# Patient Record
Sex: Male | Born: 1955 | Race: White | Hispanic: No | Marital: Married | Smoking: Never smoker
Health system: Southern US, Community
[De-identification: ages and names within clinical notes are randomized; demographics above are authoritative.]

## PROBLEM LIST (undated history)

## (undated) DIAGNOSIS — J45909 Unspecified asthma, uncomplicated: Secondary | ICD-10-CM

## (undated) DIAGNOSIS — K219 Gastro-esophageal reflux disease without esophagitis: Secondary | ICD-10-CM

## (undated) DIAGNOSIS — R059 Cough, unspecified: Secondary | ICD-10-CM

## (undated) DIAGNOSIS — G2581 Restless legs syndrome: Secondary | ICD-10-CM

## (undated) DIAGNOSIS — E1165 Type 2 diabetes mellitus with hyperglycemia: Secondary | ICD-10-CM

## (undated) DIAGNOSIS — R51 Headache: Secondary | ICD-10-CM

## (undated) DIAGNOSIS — IMO0001 Reserved for inherently not codable concepts without codable children: Secondary | ICD-10-CM

## (undated) DIAGNOSIS — R05 Cough: Secondary | ICD-10-CM

## (undated) DIAGNOSIS — R7989 Other specified abnormal findings of blood chemistry: Secondary | ICD-10-CM

## (undated) DIAGNOSIS — T7840XA Allergy, unspecified, initial encounter: Secondary | ICD-10-CM

## (undated) DIAGNOSIS — A6000 Herpesviral infection of urogenital system, unspecified: Secondary | ICD-10-CM

## (undated) DIAGNOSIS — J189 Pneumonia, unspecified organism: Secondary | ICD-10-CM

## (undated) DIAGNOSIS — E785 Hyperlipidemia, unspecified: Secondary | ICD-10-CM

## (undated) DIAGNOSIS — R519 Headache, unspecified: Secondary | ICD-10-CM

## (undated) HISTORY — DX: Type 2 diabetes mellitus with hyperglycemia: E11.65

## (undated) HISTORY — DX: Cough: R05

## (undated) HISTORY — DX: Hyperlipidemia, unspecified: E78.5

## (undated) HISTORY — DX: Other specified abnormal findings of blood chemistry: R79.89

## (undated) HISTORY — DX: Headache: R51

## (undated) HISTORY — DX: Unspecified asthma, uncomplicated: J45.909

## (undated) HISTORY — DX: Herpesviral infection of urogenital system, unspecified: A60.00

## (undated) HISTORY — DX: Allergy, unspecified, initial encounter: T78.40XA

## (undated) HISTORY — DX: Headache, unspecified: R51.9

## (undated) HISTORY — PX: TONSILLECTOMY: SUR1361

## (undated) HISTORY — PX: APPENDECTOMY: SHX54

## (undated) HISTORY — DX: Restless legs syndrome: G25.81

## (undated) HISTORY — DX: Cough, unspecified: R05.9

## (undated) HISTORY — DX: Pneumonia, unspecified organism: J18.9

## (undated) HISTORY — DX: Reserved for inherently not codable concepts without codable children: IMO0001

---

## 2002-06-29 ENCOUNTER — Emergency Department (HOSPITAL_COMMUNITY): Admission: EM | Admit: 2002-06-29 | Discharge: 2002-06-29 | Payer: Self-pay | Admitting: Emergency Medicine

## 2011-09-25 ENCOUNTER — Encounter: Payer: Self-pay | Admitting: *Deleted

## 2011-09-25 ENCOUNTER — Emergency Department (HOSPITAL_BASED_OUTPATIENT_CLINIC_OR_DEPARTMENT_OTHER)
Admission: EM | Admit: 2011-09-25 | Discharge: 2011-09-25 | Disposition: A | Discharge status: Home / Self Care | Payer: Managed Care, Other (non HMO) | Attending: Emergency Medicine | Admitting: Emergency Medicine

## 2011-09-25 ENCOUNTER — Emergency Department (INDEPENDENT_AMBULATORY_CARE_PROVIDER_SITE_OTHER): Payer: Managed Care, Other (non HMO)

## 2011-09-25 DIAGNOSIS — E119 Type 2 diabetes mellitus without complications: Secondary | ICD-10-CM | POA: Insufficient documentation

## 2011-09-25 DIAGNOSIS — K219 Gastro-esophageal reflux disease without esophagitis: Secondary | ICD-10-CM | POA: Insufficient documentation

## 2011-09-25 DIAGNOSIS — R112 Nausea with vomiting, unspecified: Secondary | ICD-10-CM

## 2011-09-25 HISTORY — DX: Gastro-esophageal reflux disease without esophagitis: K21.9

## 2011-09-25 LAB — DIFFERENTIAL
Basophils Absolute: 0 10*3/uL (ref 0.0–0.1)
Eosinophils Absolute: 0.1 10*3/uL (ref 0.0–0.7)
Eosinophils Relative: 1 % (ref 0–5)
Lymphocytes Relative: 6 % — ABNORMAL LOW (ref 12–46)
Lymphs Abs: 0.5 10*3/uL — ABNORMAL LOW (ref 0.7–4.0)
Neutrophils Relative %: 84 % — ABNORMAL HIGH (ref 43–77)

## 2011-09-25 LAB — COMPREHENSIVE METABOLIC PANEL
ALT: 18 U/L (ref 0–53)
AST: 18 U/L (ref 0–37)
Alkaline Phosphatase: 30 U/L — ABNORMAL LOW (ref 39–117)
Calcium: 9.4 mg/dL (ref 8.4–10.5)
Potassium: 3.6 mEq/L (ref 3.5–5.1)
Sodium: 136 mEq/L (ref 135–145)
Total Protein: 6.9 g/dL (ref 6.0–8.3)

## 2011-09-25 LAB — CBC
MCH: 28.3 pg (ref 26.0–34.0)
MCV: 82.4 fL (ref 78.0–100.0)
Platelets: 176 10*3/uL (ref 150–400)
RBC: 4.99 MIL/uL (ref 4.22–5.81)
RDW: 13 % (ref 11.5–15.5)
WBC: 9.2 10*3/uL (ref 4.0–10.5)

## 2011-09-25 LAB — URINALYSIS, ROUTINE W REFLEX MICROSCOPIC
Bilirubin Urine: NEGATIVE
Glucose, UA: NEGATIVE mg/dL
Hgb urine dipstick: NEGATIVE
Specific Gravity, Urine: 1.034 — ABNORMAL HIGH (ref 1.005–1.030)
pH: 5.5 (ref 5.0–8.0)

## 2011-09-25 MED ORDER — SODIUM CHLORIDE 0.9 % IV SOLN
999.0000 mL | Freq: Once | INTRAVENOUS | Status: AC
  Administered 2011-09-25: 1000 mL via INTRAVENOUS

## 2011-09-25 MED ORDER — ONDANSETRON HCL 4 MG/2ML IJ SOLN
4.0000 mg | Freq: Once | INTRAMUSCULAR | Status: AC
  Administered 2011-09-25: 4 mg via INTRAVENOUS
  Filled 2011-09-25: qty 2

## 2011-09-25 MED ORDER — ONDANSETRON HCL 4 MG PO TABS: 4.0000 mg | ORAL_TABLET | Freq: Four times a day (QID) | ORAL | Status: AC

## 2011-09-25 NOTE — ED Provider Notes (Signed)
History     CSN: [id number] Arrival date & time: 09/25/2011  7:30 AM   First MD Initiated Contact with Patient 09/25/11 [phone]      Chief Complaint  Patient presents with  . Emesis    (Consider location/radiation/quality/duration/timing/severity/associated sxs/prior treatment) HPI Patient presents with complaint of emesis which occurs after meals primarily and also primarily in the evening. He states he has had symptoms of reflux with sensation of acid in his mouth over the past 3 months. He saw his primary care doctor who started him on Prilosec and Zantac. He states these have helped the sensation of acid but he still feels a fullness in his stomach and nausea with vomiting after meals in the evening. He denies any abdominal pain. He does state that he feels somewhat constipated and has some difficulty in pressure with bowel movements. No blood in vomit. No fevers or chills. He has one prior surgery of appendectomy. He states he can eat and drink normally during the day and at night he vomits after meals.  Past Medical History  Diagnosis Date  . Acid reflux   . Diabetes mellitus     Past Surgical History  Procedure Date  . Appendectomy    appendectomy  No family history on file.  History  Substance Use Topics  . Smoking status: Never Smoker   . Smokeless tobacco: Not on file  . Alcohol Use: No      Review of Systems ROS reviewed and otherwise negative except for mentioned in HPI  Allergies  Review of patient's allergies indicates no known allergies.  Home Medications   Current Outpatient Rx  Name Route Sig Dispense Refill  . ALBUTEROL IN Inhalation Inhale into the lungs as needed.      [place] IN Inhalation Inhale into the lungs.      Marland Kitchen VYTORIN PO Oral Take by mouth.      Marland Kitchen PRILOSEC PO Oral Take by mouth.      Marland Kitchen JANUMET PO Oral Take by mouth.      . ONDANSETRON HCL 4 MG PO TABS Oral Take 1 tablet (4 mg total) by mouth every 6 (six) hours. 12 tablet 0     BP 120/73  Pulse 98  Temp(Src) 99.6 F (37.6 C) (Oral)  Resp 16  SpO2 96% Vitals reviewed Physical Exam Physical Examination: General appearance - alert, well appearing, and in no distress Mental status - alert, oriented to person, place, and time Mouth - mucous membranes moist, pharynx normal without lesions Chest - clear to auscultation, no wheezes, rales or rhonchi, symmetric air entry Heart - normal rate, regular rhythm, normal S1, S2, no murmurs, rubs, clicks or gallops Abdomen - soft, nontender, nondistended, no masses or organomegaly Neurological - motor and sensory grossly normal bilaterally Musculoskeletal - no joint tenderness, deformity or swelling Extremities - peripheral pulses normal, no pedal edema, no clubbing or cyanosis Skin - normal coloration and turgor, no rashes, no suspicious skin lesions noted  ED Course  Procedures (including critical care time) Labs, acute abdominal series, IV fluids and zofran given.  Labs Reviewed  DIFFERENTIAL - Abnormal; Notable for the following:    Neutrophils Relative 84 (*)    Lymphocytes Relative 6 (*)    Lymphs Abs 0.5 (*)    All other components within normal limits  COMPREHENSIVE METABOLIC PANEL - Abnormal; Notable for the following:    Glucose, Bld 166 (*)    Alkaline Phosphatase 30 (*)    All other components within normal  limits  URINALYSIS, ROUTINE W REFLEX MICROSCOPIC - Abnormal; Notable for the following:    Specific Gravity, Urine 1.034 (*)    Ketones, ur 15 (*)    All other components within normal limits  CBC  LIPASE, BLOOD   Dg Abd Acute W/chest  09/25/2011  *RADIOLOGY REPORT*  Clinical Data: Nausea, vomiting  ACUTE ABDOMEN SERIES (ABDOMEN 2 VIEW & CHEST 1 VIEW)  Comparison: None.  Findings: Mass-like consolidation in the right upper lobe suprahilar region.  No free air.  Small bowel decompressed.  Normal distribution of gas and stool throughout the colon.  No abnormal abdominal calcifications.  Regional  bones unremarkable.  IMPRESSION:  1.  Normal bowel gas pattern.  No free air. 2.  Right upper lobe mass-like consolidation.  Follow up recommended to confirm appropriate resolution and exclude neoplasm.  Original Report Authenticated By: Thora Lance III, M.D.   9:08 AM Labs, urine, acute abdominal series without significant findings.  Pt notified of RUL abnormality on CXR  He will need outpatient CT scan to further evalaute.  Pt attempting fluid trial now.  9:23 AM  Pt tolerated fluid trial  1. Nausea & vomiting   2. GERD (gastroesophageal reflux disease)       MDM  Patient with nausea and vomiting after meals usually at night as well as increased belching. Labs and urine were both reassuring with no significant abnormalities. Acute abdominal series showed no bowel abnormality. Did show consolidation in the right upper lobe which I do not believe to be a pneumonia. This was discussed with with the patient and he will followup with his doctor for either repeat chest x-ray or chest CT to further evaluate. Patient t oral trial. We'll discharge with prescription for Zofran and he is to continue taking his Prilosec and arrange for followup with his PMD. He is agreeable with this plan and was given strict discharge precautions        Ethelda Chick, MD 09/25/11 [phone]

## 2011-09-25 NOTE — ED Notes (Signed)
Patient states that he has has increasing issues with nausea/vomiting,, placed on prilosec for the past two weeks which has relieved some acid reflux, but still continues to be queasy & experiences vomiting approx 30 minutes after eating, reduced appetite, feels constipated at times and hurts stomach to try and have a BM. Constant burping and gas.Came in today since he threw up last night after dinner, last night and this morning after dinner.

## 2011-09-29 DIAGNOSIS — J189 Pneumonia, unspecified organism: Secondary | ICD-10-CM

## 2011-09-29 HISTORY — DX: Pneumonia, unspecified organism: J18.9

## 2013-03-28 ENCOUNTER — Other Ambulatory Visit: Payer: Self-pay | Admitting: Family Medicine

## 2013-03-28 LAB — COMPLETE METABOLIC PANEL WITH GFR
ALT: 12 U/L (ref 0–53)
AST: 12 U/L (ref 0–37)
Albumin: 4.4 g/dL (ref 3.5–5.2)
Alkaline Phosphatase: 31 U/L — ABNORMAL LOW (ref 39–117)
BUN: 21 mg/dL (ref 6–23)
CO2: 23 mEq/L (ref 19–32)
Calcium: 9.5 mg/dL (ref 8.4–10.5)
Chloride: 105 mEq/L (ref 96–112)
Creat: 1.28 mg/dL (ref 0.50–1.35)
GFR, Est African American: 72 mL/min
GFR, Est Non African American: 62 mL/min
Glucose, Bld: 157 mg/dL — ABNORMAL HIGH (ref 70–99)
Potassium: 4.8 mEq/L (ref 3.5–5.3)
Sodium: 137 mEq/L (ref 135–145)
Total Bilirubin: 0.5 mg/dL (ref 0.3–1.2)
Total Protein: 6.3 g/dL (ref 6.0–8.3)

## 2013-03-28 LAB — LIPID PANEL
Cholesterol: 216 mg/dL — ABNORMAL HIGH (ref 0–200)
HDL: 48 mg/dL (ref >39)
LDL Cholesterol: 135 mg/dL — ABNORMAL HIGH (ref 0–99)
Total CHOL/HDL Ratio: 4.5 Ratio
Triglycerides: 163 mg/dL — ABNORMAL HIGH (ref <150)
VLDL: 33 mg/dL (ref 0–40)

## 2013-03-29 LAB — MICROALBUMIN, URINE: Microalb, Ur: 0.5 mg/dL (ref 0.00–1.89)

## 2013-03-29 LAB — C-PEPTIDE: C-Peptide: 1.75 ng/mL (ref 0.80–3.90)

## 2013-03-29 LAB — INSULIN, RANDOM: Insulin: 8 u[IU]/mL (ref 3–28)

## 2013-07-30 ENCOUNTER — Other Ambulatory Visit: Payer: Self-pay | Admitting: Family Medicine

## 2013-07-31 ENCOUNTER — Other Ambulatory Visit: Payer: Self-pay | Admitting: Family Medicine

## 2013-08-19 ENCOUNTER — Other Ambulatory Visit: Payer: Self-pay | Admitting: *Deleted

## 2013-08-19 DIAGNOSIS — R5381 Other malaise: Secondary | ICD-10-CM

## 2013-08-19 DIAGNOSIS — E119 Type 2 diabetes mellitus without complications: Secondary | ICD-10-CM

## 2013-08-19 DIAGNOSIS — E785 Hyperlipidemia, unspecified: Secondary | ICD-10-CM

## 2013-08-20 ENCOUNTER — Other Ambulatory Visit: Payer: PRIVATE HEALTH INSURANCE

## 2013-08-20 LAB — CBC WITH DIFFERENTIAL/PLATELET
Basophils Absolute: 0 10*3/uL (ref 0.0–0.1)
Basophils Relative: 1 % (ref 0–1)
Eosinophils Absolute: 0.8 10*3/uL — ABNORMAL HIGH (ref 0.0–0.7)
Eosinophils Relative: 10 % — ABNORMAL HIGH (ref 0–5)
HCT: 50.7 % (ref 39.0–52.0)
Hemoglobin: 17.3 g/dL — ABNORMAL HIGH (ref 13.0–17.0)
Lymphocytes Relative: 19 % (ref 12–46)
Lymphs Abs: 1.6 10*3/uL (ref 0.7–4.0)
MCH: 28.5 pg (ref 26.0–34.0)
MCHC: 34.1 g/dL (ref 30.0–36.0)
MCV: 83.7 fL (ref 78.0–100.0)
Monocytes Absolute: 0.8 10*3/uL (ref 0.1–1.0)
Monocytes Relative: 9 % (ref 3–12)
Neutro Abs: 5.3 10*3/uL (ref 1.7–7.7)
Neutrophils Relative %: 61 % (ref 43–77)
Platelets: 214 10*3/uL (ref 150–400)
RBC: 6.06 MIL/uL — ABNORMAL HIGH (ref 4.22–5.81)
RDW: 14 % (ref 11.5–15.5)
WBC: 8.6 10*3/uL (ref 4.0–10.5)

## 2013-08-20 LAB — COMPLETE METABOLIC PANEL WITH GFR
ALT: 13 U/L (ref 0–53)
AST: 14 U/L (ref 0–37)
Albumin: 4.5 g/dL (ref 3.5–5.2)
Alkaline Phosphatase: 26 U/L — ABNORMAL LOW (ref 39–117)
BUN: 25 mg/dL — ABNORMAL HIGH (ref 6–23)
CO2: 30 mEq/L (ref 19–32)
Calcium: 9.4 mg/dL (ref 8.4–10.5)
Chloride: 100 mEq/L (ref 96–112)
Creat: 1.26 mg/dL (ref 0.50–1.35)
GFR, Est African American: 73 mL/min
GFR, Est Non African American: 63 mL/min
Glucose, Bld: 138 mg/dL — ABNORMAL HIGH (ref 70–99)
Potassium: 4.5 mEq/L (ref 3.5–5.3)
Sodium: 137 mEq/L (ref 135–145)
Total Bilirubin: 0.6 mg/dL (ref 0.3–1.2)
Total Protein: 6.6 g/dL (ref 6.0–8.3)

## 2013-08-20 LAB — LIPID PANEL
Cholesterol: 242 mg/dL — ABNORMAL HIGH (ref 0–200)
HDL: 52 mg/dL (ref >39)
LDL Cholesterol: 116 mg/dL — ABNORMAL HIGH (ref 0–99)
Total CHOL/HDL Ratio: 4.7 Ratio
Triglycerides: 368 mg/dL — ABNORMAL HIGH (ref <150)
VLDL: 74 mg/dL — ABNORMAL HIGH (ref 0–40)

## 2013-08-20 LAB — HEMOGLOBIN A1C
Hgb A1c MFr Bld: 7.1 % — ABNORMAL HIGH (ref <5.7)
Mean Plasma Glucose: 157 mg/dL — ABNORMAL HIGH (ref <117)

## 2013-08-22 ENCOUNTER — Encounter: Payer: Self-pay | Admitting: *Deleted

## 2013-08-22 DIAGNOSIS — E291 Testicular hypofunction: Secondary | ICD-10-CM

## 2013-08-22 DIAGNOSIS — R519 Headache, unspecified: Secondary | ICD-10-CM | POA: Insufficient documentation

## 2013-08-22 DIAGNOSIS — A6000 Herpesviral infection of urogenital system, unspecified: Secondary | ICD-10-CM | POA: Insufficient documentation

## 2013-08-22 DIAGNOSIS — R51 Headache: Secondary | ICD-10-CM | POA: Insufficient documentation

## 2013-08-22 DIAGNOSIS — G2581 Restless legs syndrome: Secondary | ICD-10-CM | POA: Insufficient documentation

## 2013-08-22 DIAGNOSIS — K219 Gastro-esophageal reflux disease without esophagitis: Secondary | ICD-10-CM

## 2013-08-22 DIAGNOSIS — J168 Pneumonia due to other specified infectious organisms: Secondary | ICD-10-CM

## 2013-08-23 ENCOUNTER — Ambulatory Visit (INDEPENDENT_AMBULATORY_CARE_PROVIDER_SITE_OTHER): Payer: Private Health Insurance - Indemnity | Admitting: Family Medicine

## 2013-08-23 ENCOUNTER — Encounter: Payer: Self-pay | Admitting: Family Medicine

## 2013-08-23 VITALS — BP 112/78 | HR 90 | Resp 16 | Wt 164.0 lb

## 2013-08-23 DIAGNOSIS — IMO0001 Reserved for inherently not codable concepts without codable children: Secondary | ICD-10-CM

## 2013-08-23 DIAGNOSIS — E785 Hyperlipidemia, unspecified: Secondary | ICD-10-CM

## 2013-08-23 DIAGNOSIS — Z23 Encounter for immunization: Secondary | ICD-10-CM

## 2013-08-23 DIAGNOSIS — L309 Dermatitis, unspecified: Secondary | ICD-10-CM

## 2013-08-23 DIAGNOSIS — G43909 Migraine, unspecified, not intractable, without status migrainosus: Secondary | ICD-10-CM

## 2013-08-23 DIAGNOSIS — F411 Generalized anxiety disorder: Secondary | ICD-10-CM

## 2013-08-23 DIAGNOSIS — L259 Unspecified contact dermatitis, unspecified cause: Secondary | ICD-10-CM

## 2013-08-23 DIAGNOSIS — J45909 Unspecified asthma, uncomplicated: Secondary | ICD-10-CM

## 2013-08-23 DIAGNOSIS — B009 Herpesviral infection, unspecified: Secondary | ICD-10-CM

## 2013-08-23 MED ORDER — SITAGLIPTIN PHOS-METFORMIN HCL 50-1000 MG PO TABS: 1.0000 | ORAL_TABLET | Freq: Two times a day (BID) | ORAL | Status: AC

## 2013-08-23 MED ORDER — DESONIDE 0.05 % EX CREA: TOPICAL_CREAM | Freq: Two times a day (BID) | CUTANEOUS | Status: DC

## 2013-08-23 MED ORDER — SELENIUM SULFIDE 2.5 % EX LOTN: TOPICAL_LOTION | CUTANEOUS | Status: AC

## 2013-08-23 MED ORDER — ESCITALOPRAM OXALATE 10 MG PO TABS: ORAL_TABLET | ORAL | Status: DC

## 2013-08-23 MED ORDER — BUDESONIDE-FORMOTEROL FUMARATE 160-4.5 MCG/ACT IN AERO: 2.0000 | INHALATION_SPRAY | Freq: Two times a day (BID) | RESPIRATORY_TRACT | Status: DC

## 2013-08-23 MED ORDER — GLUCOSE BLOOD VI STRP: ORAL_STRIP | Status: AC

## 2013-08-23 MED ORDER — CANAGLIFLOZIN 300 MG PO TABS: 1.0000 | ORAL_TABLET | Freq: Every day | ORAL | Status: AC

## 2013-08-23 MED ORDER — VALACYCLOVIR HCL 1 G PO TABS: 1000.0000 mg | ORAL_TABLET | Freq: Two times a day (BID) | ORAL | Status: DC

## 2013-08-23 MED ORDER — CRESTOR 40 MG PO TABS: 40.0000 mg | ORAL_TABLET | Freq: Every day | ORAL | Status: DC

## 2013-08-23 NOTE — Patient Instructions (Addendum)
1)  Mood - Take 1 Prozac with 1/4 of the new pill Lexapro till the Prozac is gone then take 1/2 of the Lexapro and increase to 1 tab if  Needed.    2)  Blood Sugar - Your A1c is much better keep up the good work.  Look into the book "The End of Diabetes".   Diabetes and Exercise Regular exercise is important and can help:   Control blood glucose (sugar).  Decrease blood pressure.    Control blood lipids (cholesterol, triglycerides).  Improve overall health. BENEFITS FROM EXERCISE  Improved fitness.  Improved flexibility.  Improved endurance.  Increased bone density.  Weight control.  Increased muscle strength.  Decreased body fat.  Improvement of the body's use of insulin, a hormone.  Increased insulin sensitivity.  Reduction of insulin needs.  Reduced stress and tension.  Helps you feel better. People with diabetes who add exercise to their lifestyle gain additional benefits, including:  Weight loss.  Reduced appetite.  Improvement of the body's use of blood glucose.  Decreased risk factors for heart disease:  Lowering of cholesterol and triglycerides.  Raising the level of good cholesterol (high-density lipoproteins, HDL).  Lowering blood sugar.  Decreased blood pressure. TYPE 1 DIABETES AND EXERCISE  Exercise will usually lower your blood glucose.  If blood glucose is greater than 240 mg/dl, check urine ketones. If ketones are present, do not exercise.  Location of the insulin injection sites may need to be adjusted with exercise. Avoid injecting insulin into areas of the body that will be exercised. For example, avoid injecting insulin into:  The arms when playing tennis.  The legs when jogging. For more information, discuss this with your caregiver.  Keep a record of:  Food intake.  Type and amount of exercise.  Expected peak times of insulin action.  Blood glucose levels. Do this before, during, and after exercise. Review your  records with your caregiver. This will help you to develop guidelines for adjusting food intake and insulin amounts.  TYPE 2 DIABETES AND EXERCISE  Regular physical activity can help control blood glucose.  Exercise is important because it may:  Increase the body's sensitivity to insulin.  Improve blood glucose control.  Exercise reduces the risk of heart disease. It decreases serum cholesterol and triglycerides. It also lowers blood pressure.  Those who take insulin or oral hypoglycemic agents should watch for signs of hypoglycemia. These signs include dizziness, shaking, sweating, chills, and confusion.  Body water is lost during exercise. It must be replaced. This will help to avoid loss of body fluids (dehydration) or heat stroke. Be sure to talk to your caregiver before starting an exercise program to make sure it is safe for you. Remember, any activity is better than none.  Document Released: 02/04/2004 Document Revised: 02/06/2012 Document Reviewed: 05/21/2009 [facility] Patient Information 2014 [place], [place].

## 2013-08-23 NOTE — Progress Notes (Signed)
Subjective:    Patient ID: John Chandler, male    DOB: [birth year], 57 y.o.   MRN: [id number]  HPI  John Chandler is here today to go over his most recent lab results, to discuss the conditions listed below and to get some of her medications refilled.    1)  Type II DM:  He has been taking Invokana 300 mg and Janumet [phone] mg and he feels that his sugars are around 130 daily.  He needs refills on both medications.   2)  Asthma:  He needs a refill for Symbicort.    3)  Mood:  He has been on Prozac for a long time and his mood is stable on it.  He has noted some increased agitation when he takes Topamax.  He wonders if there is a medication interaction between the two medications.    4)  HSV:  He needs a refill on his Valtrex.  He has not had any outbreaks but he thinks his pills may be expired.     Review of Systems  Constitutional: Negative.   HENT: Negative.   Eyes: Negative.   Respiratory: Negative.   Cardiovascular: Negative.   Gastrointestinal: Negative.   Endocrine: Negative.   Genitourinary: Negative.   Musculoskeletal: Negative.   Skin: Negative.   Allergic/Immunologic: Negative.   Neurological: Negative.   Hematological: Negative.   Psychiatric/Behavioral: The patient is nervous/anxious.        Usually when he takes Topamax.     Past Medical History  Diagnosis Date  . Acid reflux   . Diabetes mellitus   . Allergy   . Asthma   . Genital herpes   . FHx: migraine headaches      Family History  Problem Relation Age of Onset  . Diabetes Mother   . Heart disease Father   . Thyroid disease Sister   . Diabetes Maternal Grandmother      History   Social History Narrative   Marital Status: Married Clydie Braun)   Children:  None    Pets:  Cats (2); Dog (1)    Living Situation: Lives with spouse   Occupation: Civil engineer, contracting Transport planner)   Education:  Engineer, agricultural - Water quality scientist   Tobacco Use/Exposure:  None    Alcohol Use:  None   Drug Use:  None   Diet:  Regular   Exercise:  He has started walking more on his treadmill.    Hobbies:  Reading, Working on Projects        Objective:   Physical Exam  Vitals reviewed. Constitutional: He is oriented to person, place, and time. He appears well-developed and well-nourished. No distress.  HENT:  Head: Normocephalic.  Eyes: No scleral icterus.  Neck: Neck supple. No thyromegaly present.  Cardiovascular: Normal rate, regular rhythm and normal heart sounds.   Pulmonary/Chest: Effort normal and breath sounds normal.  Musculoskeletal: Normal range of motion. He exhibits no edema.  Neurological: He is alert and oriented to person, place, and time.  Skin: Skin is warm and dry. No rash noted.  Psychiatric: He has a normal mood and affect. His behavior is normal. Judgment and thought content normal.      Assessment & Plan:    Erick was seen today for medication management.  Diagnoses and associated orders for this visit:  Type II or unspecified type diabetes mellitus without mention of complication, uncontrolled - sitaGLIPtin-metformin (JANUMET) 50-1000 MG per tablet; Take 1 tablet by mouth 2 (two) times daily with a  meal. - Canagliflozin (INVOKANA) 300 MG TABS; Take 1 tablet (300 mg total) by mouth daily. - glucose blood (FREESTYLE LITE) test strip; Check sugar daily as directed.  Migraine, unspecified, without mention of intractable migraine without mention of status migrainosus  Unspecified asthma(493.90) - budesonide-formoterol (SYMBICORT) 160-4.5 MCG/ACT inhaler; Inhale 2 puffs into the lungs 2 (two) times daily.  HSV-2 infection - valACYclovir (VALTREX) 1000 MG tablet; Take 1 tablet (1,000 mg total) by mouth 2 (two) times daily. PRN  Anxiety state, unspecified - escitalopram (LEXAPRO) 10 MG tablet; Take 1/2 - 1 tab po q day  Other and unspecified hyperlipidemia - CRESTOR 40 MG tablet; Take 1 tablet (40 mg total) by mouth daily.  Eczema - desonide  (DESOWEN) 0.05 % cream; Apply topically 2 (two) times daily. - selenium sulfide (SELSUN) 2.5 % shampoo; Apply to skin and let stand for 15 mins before rinsing.  Need for prophylactic vaccination and inoculation against influenza - Flu Vaccine QUAD 36+ mos PF IM (Fluarix)

## 2013-10-03 ENCOUNTER — Ambulatory Visit (INDEPENDENT_AMBULATORY_CARE_PROVIDER_SITE_OTHER): Payer: Private Health Insurance - Indemnity | Admitting: Family Medicine

## 2013-10-03 ENCOUNTER — Ambulatory Visit (HOSPITAL_BASED_OUTPATIENT_CLINIC_OR_DEPARTMENT_OTHER)
Admission: RE | Admit: 2013-10-03 | Discharge: 2013-10-03 | Disposition: A | Discharge status: Home / Self Care | Payer: Private Health Insurance - Indemnity | Source: Ambulatory Visit | Attending: Family Medicine | Admitting: Family Medicine

## 2013-10-03 ENCOUNTER — Encounter: Payer: Self-pay | Admitting: Family Medicine

## 2013-10-03 VITALS — BP 112/75 | HR 98 | Resp 16 | Wt 169.0 lb

## 2013-10-03 DIAGNOSIS — J438 Other emphysema: Secondary | ICD-10-CM | POA: Insufficient documentation

## 2013-10-03 DIAGNOSIS — R059 Cough, unspecified: Secondary | ICD-10-CM | POA: Insufficient documentation

## 2013-10-03 DIAGNOSIS — K219 Gastro-esophageal reflux disease without esophagitis: Secondary | ICD-10-CM

## 2013-10-03 DIAGNOSIS — R062 Wheezing: Secondary | ICD-10-CM | POA: Insufficient documentation

## 2013-10-03 DIAGNOSIS — R05 Cough: Secondary | ICD-10-CM | POA: Insufficient documentation

## 2013-10-03 DIAGNOSIS — J4541 Moderate persistent asthma with (acute) exacerbation: Secondary | ICD-10-CM | POA: Insufficient documentation

## 2013-10-03 MED ORDER — HYDROCODONE-HOMATROPINE 5-1.5 MG/5ML PO SYRP: 5.0000 mL | ORAL_SOLUTION | Freq: Four times a day (QID) | ORAL | Status: DC | PRN

## 2013-10-03 MED ORDER — ACLIDINIUM BROMIDE 400 MCG/ACT IN AEPB: 1.0000 | INHALATION_SPRAY | Freq: Two times a day (BID) | RESPIRATORY_TRACT | Status: DC

## 2013-10-03 MED ORDER — METHYLPREDNISOLONE SODIUM SUCC 125 MG IJ SOLR
125.0000 mg | Freq: Once | INTRAMUSCULAR | Status: AC
  Administered 2013-10-03: 125 mg via INTRAMUSCULAR

## 2013-10-03 MED ORDER — BENZONATATE 200 MG PO CAPS: 200.0000 mg | ORAL_CAPSULE | Freq: Three times a day (TID) | ORAL | Status: DC | PRN

## 2013-10-03 MED ORDER — ALBUTEROL SULFATE (5 MG/ML) 0.5% IN NEBU
5.0000 mg | INHALATION_SOLUTION | Freq: Once | RESPIRATORY_TRACT | Status: AC
  Administered 2013-10-03: 5 mg via RESPIRATORY_TRACT

## 2013-10-03 MED ORDER — METHYLPREDNISOLONE SODIUM SUCC 125 MG IJ SOLR: 125.0000 mg | Freq: Once | INTRAMUSCULAR | Status: DC

## 2013-10-03 NOTE — Assessment & Plan Note (Signed)
He received 2 nebulizer treatments in the office (Perforomist) and his wheezing improved.  He was also given an injection of Solu-Medrol and is to get back to doing his Symbicort regularly.

## 2013-10-03 NOTE — Progress Notes (Signed)
  Subjective:    Patient ID: John Chandler, male    DOB: 07-08-1956, 57 y.o.   MRN: [id number]   HPI  [name] is in today to discuss his respiratory symptoms.  Several weeks ago, he developed chest congestion and cough.  He worsened to the point that he was having difficulty breathing so he went to the [facility] in [place] one weekend in early September.  He says that a chest x-ray was done which did not show pneumonia but since he was so sick, he was given Levaquin, Prednisone and Hycodan Cough Syrup.  He says that he started to feel a little better when he started these medications  but the improvement did not last long.  He is in today wanting to be put on what he took back in 09/2011 when he had pneumonia.  He says that after he took this combination of medications (Augmentin/Zithromax), the cough that he had suffered from for years went away.  He took Singulair for years which helped his cough but stopped taking it after his cough resolved.  He has Symbicort but admits that he has not been using it.  He has tried using his albuterol inhaler which he says does not help him very much.     Review of Systems  Constitutional: Negative.  Negative for fever.  Eyes: Negative.   Respiratory: Positive for cough, chest tightness, shortness of breath and wheezing.   Gastrointestinal: Negative.   Endocrine: Negative.   Genitourinary: Negative.   Musculoskeletal: Negative.   Skin: Negative.   Allergic/Immunologic: Negative.   Hematological: Negative.   Psychiatric/Behavioral: Negative.     Past Medical History  Diagnosis Date  . Acid reflux   . Allergy   . Genital herpes   . Asthma   . Type II or unspecified type diabetes mellitus without mention of complication, uncontrolled   . Headache   . Low testosterone   . Pneumonia 09/2011  . Restless leg syndrome      Family History  Problem Relation Age of Onset  . Diabetes Mother   . Heart disease Father   . Thyroid disease Sister    . Diabetes Maternal Grandmother     History   Social History Narrative   Marital Status: Married Clydie Braun)   Children:  None    Pets:  Cats (2); Dog (1)    Living Situation: Lives with spouse   Occupation: Civil engineer, contracting Transport planner)   Education:  Engineer, agricultural - Water quality scientist   Tobacco Use/Exposure:  None    Alcohol Use:  None   Drug Use:  None   Diet:  Regular   Exercise:  He has started walking more on his treadmill.    Hobbies:  Reading, Working on Projects        Objective:   Physical Exam  Vitals reviewed. Constitutional: He is oriented to person, place, and time. He appears well-developed. No distress.  Cardiovascular: Normal rate and regular rhythm.   Pulmonary/Chest: He has wheezes. He exhibits tenderness.  Neurological: He is alert and oriented to person, place, and time.  Skin: Skin is warm and dry. No rash noted.  Psychiatric: He has a normal mood and affect. His behavior is normal. Judgment and thought content normal.      Assessment & Plan:

## 2013-10-03 NOTE — Assessment & Plan Note (Signed)
He was encouraged to get back on his omeprazole to see if this will help his cough.

## 2013-10-03 NOTE — Assessment & Plan Note (Addendum)
Haleem had struggled with a chronic cough for years.  Initially, he was put on Singulair (2004) and this seemed to help his cough.  After several years, this did not seem to control his cough so Symbicort was added.  I explained to him that it would not really be appropriate to treat him with additional antibiotics since he does not have pneumonia and has recently completed a round of Levaquin.  His most current x-ray shows that he has emphysematous changes which is surprising since he has never been a smoker.  If he has emphysema vs asthma then he might do better on Tudorza than Singulair.  I gave him several samples to use until he sees Dr. Delford Field.  He was encouraged to use some Umcka Cold Care/Mucinex DM and was given prescriptions for Hycodan and Tessalon Perles.  He was also  given a copy of Dr. Lynelle Doctor "Cyclical Cough" handout and encouraged to try it this weekend.  He is up to date on his Pneumovax.

## 2013-10-03 NOTE — Patient Instructions (Signed)
1)  Wheezing/Cough -   Symbicort - 2 puffs twice a day and rinse out mouth Tudorza - 1 puff twice a day Mucinex DM - 1200/60 2 times per day Umcka Cold Care (2 droppers) 4 x day  Try what is recommended on the Cyclical Coughing handout.    Get back on the Prilosec for now until the coughing has stopped.  We are going to set you up with Dr. Shan Levans.     Cough, Adult  A cough is a reflex that helps clear your throat and airways. It can help heal the body or may be a reaction to an irritated airway. A cough may only last 2 or 3 weeks (acute) or may last more than 8 weeks (chronic).  CAUSES Acute cough:  Viral or bacterial infections. Chronic cough:  Infections.  Allergies.  Asthma.  Post-nasal drip.  Smoking.  Heartburn or acid reflux.  Some medicines.  Chronic lung problems (COPD).  Cancer. SYMPTOMS   Cough.  Fever.  Chest pain.  Increased breathing rate.  High-pitched whistling sound when breathing (wheezing).  Colored mucus that you cough up (sputum). TREATMENT   A bacterial cough may be treated with antibiotic medicine.  A viral cough must run its course and will not respond to antibiotics.  Your caregiver may recommend other treatments if you have a chronic cough. HOME CARE INSTRUCTIONS   Only take over-the-counter or prescription medicines for pain, discomfort, or fever as directed by your caregiver. Use cough suppressants only as directed by your caregiver.  Use a cold steam vaporizer or humidifier in your bedroom or home to help loosen secretions.  Sleep in a semi-upright position if your cough is worse at night.  Rest as needed.  Stop smoking if you smoke. SEEK IMMEDIATE MEDICAL CARE IF:   You have pus in your sputum.  Your cough starts to worsen.  You cannot control your cough with suppressants and are losing sleep.  You begin coughing up blood.  You have difficulty breathing.  You develop pain which is getting worse or  is uncontrolled with medicine.  You have a fever. MAKE SURE YOU:   Understand these instructions.  Will watch your condition.  Will get help right away if you are not doing well or get worse. Document Released: 05/13/2011 Document Revised: 02/06/2012 Document Reviewed: 05/13/2011 [facility] Patient Information 2014 [place], [place].

## 2013-10-04 ENCOUNTER — Encounter: Payer: Self-pay | Admitting: Family Medicine

## 2013-10-27 DIAGNOSIS — Z23 Encounter for immunization: Secondary | ICD-10-CM | POA: Insufficient documentation

## 2013-10-27 NOTE — Assessment & Plan Note (Signed)
The patient confirmed that they are not allergic to eggs and have never had a bad reaction with the flu shot in the past.  The vaccination was given without difficulty.   

## 2013-11-03 DIAGNOSIS — E785 Hyperlipidemia, unspecified: Secondary | ICD-10-CM | POA: Insufficient documentation

## 2013-11-03 DIAGNOSIS — B009 Herpesviral infection, unspecified: Secondary | ICD-10-CM | POA: Insufficient documentation

## 2013-11-03 DIAGNOSIS — F411 Generalized anxiety disorder: Secondary | ICD-10-CM | POA: Insufficient documentation

## 2013-11-03 DIAGNOSIS — J45909 Unspecified asthma, uncomplicated: Secondary | ICD-10-CM | POA: Insufficient documentation

## 2013-11-03 DIAGNOSIS — L309 Dermatitis, unspecified: Secondary | ICD-10-CM | POA: Insufficient documentation

## 2013-11-03 DIAGNOSIS — G43909 Migraine, unspecified, not intractable, without status migrainosus: Secondary | ICD-10-CM | POA: Insufficient documentation

## 2013-11-03 NOTE — Addendum Note (Signed)
Addended by: Birdena Jubilee on: 11/03/2013 11:16 AM   Modules accepted: Level of Service

## 2013-11-12 ENCOUNTER — Encounter: Payer: Self-pay | Admitting: Critical Care Medicine

## 2013-11-12 ENCOUNTER — Ambulatory Visit (INDEPENDENT_AMBULATORY_CARE_PROVIDER_SITE_OTHER): Payer: Private Health Insurance - Indemnity | Admitting: Critical Care Medicine

## 2013-11-12 VITALS — BP 112/76 | HR 71 | Temp 97.7°F | Ht 68.0 in | Wt 171.0 lb

## 2013-11-12 DIAGNOSIS — R05 Cough: Secondary | ICD-10-CM

## 2013-11-12 DIAGNOSIS — J4541 Moderate persistent asthma with (acute) exacerbation: Secondary | ICD-10-CM

## 2013-11-12 DIAGNOSIS — J45901 Unspecified asthma with (acute) exacerbation: Secondary | ICD-10-CM

## 2013-11-12 DIAGNOSIS — J45902 Unspecified asthma with status asthmaticus: Secondary | ICD-10-CM

## 2013-11-12 MED ORDER — PREDNISONE 10 MG PO TABS: ORAL_TABLET | ORAL | Status: DC

## 2013-11-12 NOTE — Assessment & Plan Note (Signed)
Moderate persistent asthma with associated exacerbation with likely significant atopic features Cyclical cough likely due to lower airway inflammation Note air trapping seen on chest x-ray and pulmonary function show moderate obstruction Doubt significant reflux factors playing a role Plan Stay on symbicort two puff twice daily Prednisone 10mg  Take 4 tablets daily for 5 days then stop Stop Tudorza Use albuterol as needed Allergy labs today Return 6 weeks

## 2013-11-12 NOTE — Progress Notes (Signed)
Subjective:    Patient ID: John Chandler, male    DOB: 1956-06-07, 57 y.o.   MRN: [id number]  HPI Comments: Chronic cough for 3months, worse   Symptoms years previously and was on albuterol for same   Cough This is a chronic problem. The current episode started more than 1 year ago (worse past three months). The problem has been gradually improving. The problem occurs constantly (was constant 3 mo ago, now still constant but not as intense). The cough is non-productive. Associated symptoms include nasal congestion, postnasal drip, rhinorrhea and wheezing. Pertinent negatives include no chest pain, chills, ear congestion, ear pain, fever, headaches, heartburn, hemoptysis, myalgias, rash, sore throat, shortness of breath, sweats or weight loss. Associated symptoms comments: No hoarseness Chest is tight at times. The symptoms are aggravated by cold air, exercise, fumes, dust and pollens. He has tried a beta-agonist inhaler and prescription cough suppressant (cough syrup does help, steroid injection, mucinex this time helped, ABX have helped in the past ) for the symptoms. The treatment provided mild relief. His past medical history is significant for bronchitis. There is no history of asthma, bronchiectasis, COPD, emphysema, environmental allergies or pneumonia. Rx ABX helped in the past.     Past Medical History  Diagnosis Date  . Acid reflux   . Allergy   . Genital herpes   . Asthma   . Type II or unspecified type diabetes mellitus without mention of complication, uncontrolled   . Headache   . Low testosterone   . Pneumonia 09/2011  . Restless leg syndrome   . Hyperlipidemia   . Cough      Family History  Problem Relation Age of Onset  . Diabetes Mother   . Heart disease Father   . Thyroid disease Sister   . Diabetes Maternal Grandmother   . COPD Mother      History   Social History  . Marital Status: Married    Spouse Name: N/A    Number of Children: N/A  . Years of  Education: N/A   Occupational History  . [employer]    Social History Main Topics  . Smoking status: Never Smoker   . Smokeless tobacco: Never Used  . Alcohol Use: No  . Drug Use: No  . Sexual Activity: Yes   Other Topics Concern  . Not on file   Social History Narrative   Marital Status: Married Clydie Braun)   Children:  None    Pets:  Cats (2); Dog (1)    Living Situation: Lives with spouse   Occupation: Civil engineer, contracting Transport planner)   Education:  Engineer, agricultural - Water quality scientist   Tobacco Use/Exposure:  None    Alcohol Use:  None   Drug Use:  None   Diet:  Regular   Exercise:  He has started walking more on his treadmill.    Hobbies:  Reading, Working on Projects      No Known Allergies   Outpatient Prescriptions Prior to Visit  Medication Sig Dispense Refill  . albuterol (PROVENTIL HFA;VENTOLIN HFA) 108 (90 BASE) MCG/ACT inhaler Inhale 2 puffs into the lungs every 6 (six) hours as needed for wheezing.      . budesonide-formoterol (SYMBICORT) 160-4.5 MCG/ACT inhaler Inhale 2 puffs into the lungs 2 (two) times daily.  1 Inhaler  11  . Canagliflozin (INVOKANA) 300 MG TABS Take 1 tablet (300 mg total) by mouth daily.  30 tablet  5  . escitalopram (LEXAPRO) 10 MG tablet Take  1/2 - 1 tab po q day  30 tablet  3  . glucose blood (FREESTYLE LITE) test strip Check sugar daily as directed.  100 each  3  . Omeprazole (PRILOSEC PO) Take 20 mg by mouth as needed.       Marland Kitchen rOPINIRole (REQUIP) 2 MG tablet Take 2 mg by mouth at bedtime as needed.       . selenium sulfide (SELSUN) 2.5 % shampoo Apply to skin and let stand for 15 mins before rinsing.  120 mL  11  . sitaGLIPtin-metformin (JANUMET) 50-1000 MG per tablet Take 1 tablet by mouth 2 (two) times daily with a meal.  60 tablet  5  . topiramate (TOPAMAX) 100 MG tablet Take 100 mg by mouth daily.       . valACYclovir (VALTREX) 1000 MG tablet Take 1 tablet (1,000 mg total) by mouth 2 (two) times daily. PRN  30  tablet  0  . Aclidinium Bromide 400 MCG/ACT AEPB Inhale 1 puff into the lungs 2 (two) times daily.  1 each  11  . CRESTOR 40 MG tablet Take 1 tablet (40 mg total) by mouth daily.  30 tablet  3  . benzonatate (TESSALON) 200 MG capsule Take 1 capsule (200 mg total) by mouth 3 (three) times daily as needed for cough.  60 capsule  0  . HYDROcodone-homatropine (HYCODAN) 5-1.5 MG/5ML syrup Take 5 mLs by mouth every 6 (six) hours as needed for cough.  240 mL  0  . desonide (DESOWEN) 0.05 % cream Apply topically 2 (two) times daily.  60 g  3  . FLUoxetine (PROZAC) 20 MG capsule TAKE ONE CAPSULE BY MOUTH DAILY  30 capsule  0  . simvastatin (ZOCOR) 40 MG tablet Take 40 mg by mouth every evening.       No facility-administered medications prior to visit.      Review of Systems  Constitutional: Negative for fever, chills, weight loss, diaphoresis, activity change, appetite change, fatigue and unexpected weight change.  HENT: Positive for congestion, postnasal drip and rhinorrhea. Negative for dental problem, ear discharge, ear pain, facial swelling, hearing loss, mouth sores, nosebleeds, sinus pressure, sneezing, sore throat, tinnitus, trouble swallowing and voice change.   Eyes: Negative for photophobia, discharge, itching and visual disturbance.  Respiratory: Positive for cough and wheezing. Negative for apnea, hemoptysis, choking, chest tightness, shortness of breath and stridor.   Cardiovascular: Negative for chest pain, palpitations and leg swelling.  Gastrointestinal: Negative for heartburn, nausea, vomiting, abdominal pain, constipation, blood in stool and abdominal distention.  Genitourinary: Negative for dysuria, urgency, frequency, hematuria, flank pain, decreased urine volume and difficulty urinating.  Musculoskeletal: Negative for arthralgias, back pain, gait problem, joint swelling, myalgias, neck pain and neck stiffness.  Skin: Negative for color change, pallor and rash.   Allergic/Immunologic: Negative for environmental allergies.  Neurological: Negative for dizziness, tremors, seizures, syncope, speech difficulty, weakness, light-headedness, numbness and headaches.  Hematological: Negative for adenopathy. Does not bruise/bleed easily.  Psychiatric/Behavioral: Negative for confusion, sleep disturbance and agitation. The patient is not nervous/anxious.        Objective:   Physical Exam Filed Vitals:   11/12/13 1512  BP: 112/76  Pulse: 71  Temp: 97.7 F (36.5 C)  TempSrc: Oral  Height: 5\' 8"  (1.727 m)  Weight: 171 lb (77.565 kg)  SpO2: 96%    Gen: Pleasant, well-nourished, in no distress,  normal affect  ENT: No lesions,  mouth clear,  oropharynx clear, no postnasal drip  Neck: No JVD,  no TMG, no carotid bruits  Lungs: No use of accessory muscles, no dullness to percussion, distant breath sounds with expired wheezes  Cardiovascular: RRR, heart sounds normal, no murmur or gallops, no peripheral edema  Abdomen: soft and NT, no HSM,  BS normal  Musculoskeletal: No deformities, no cyanosis or clubbing  Neuro: alert, non focal  Skin: Warm, no lesions or rashes  No results found.  Chest x-ray reviewed and shows air trapping from November 2014  Spirometry 11/12/2013: FEV1 73% FVC 82% FEV1 to FVC ratio 71% FEF 25-75% 71% predicted       Assessment & Plan:   Moderate persistent asthma with exacerbation Moderate persistent asthma with associated exacerbation with likely significant atopic features Cyclical cough likely due to lower airway inflammation Note air trapping seen on chest x-ray and pulmonary function show moderate obstruction Doubt significant reflux factors playing a role Plan Stay on symbicort two puff twice daily Prednisone 10mg  Take 4 tablets daily for 5 days then stop Stop Tudorza Use albuterol as needed Allergy labs today Return 6 weeks    Updated Medication List Outpatient Encounter Prescriptions as of  11/12/2013  Medication Sig  . albuterol (PROVENTIL HFA;VENTOLIN HFA) 108 (90 BASE) MCG/ACT inhaler Inhale 2 puffs into the lungs every 6 (six) hours as needed for wheezing.  . budesonide-formoterol (SYMBICORT) 160-4.5 MCG/ACT inhaler Inhale 2 puffs into the lungs 2 (two) times daily.  . Canagliflozin (INVOKANA) 300 MG TABS Take 1 tablet (300 mg total) by mouth daily.  Marland Kitchen escitalopram (LEXAPRO) 10 MG tablet Take 1/2 - 1 tab po q day  . glucose blood (FREESTYLE LITE) test strip Check sugar daily as directed.  . Omeprazole (PRILOSEC PO) Take 20 mg by mouth as needed.   Marland Kitchen rOPINIRole (REQUIP) 2 MG tablet Take 2 mg by mouth at bedtime as needed.   . rosuvastatin (CRESTOR) 40 MG tablet Take 10-20 mg by mouth daily. As directed  . selenium sulfide (SELSUN) 2.5 % shampoo Apply to skin and let stand for 15 mins before rinsing.  . sitaGLIPtin-metformin (JANUMET) 50-1000 MG per tablet Take 1 tablet by mouth 2 (two) times daily with a meal.  . topiramate (TOPAMAX) 100 MG tablet Take 100 mg by mouth daily.   . valACYclovir (VALTREX) 1000 MG tablet Take 1 tablet (1,000 mg total) by mouth 2 (two) times daily. PRN  . [DISCONTINUED] Aclidinium Bromide 400 MCG/ACT AEPB Inhale 1 puff into the lungs 2 (two) times daily.  . [DISCONTINUED] CRESTOR 40 MG tablet Take 1 tablet (40 mg total) by mouth daily.  . benzonatate (TESSALON) 200 MG capsule Take 1 capsule (200 mg total) by mouth 3 (three) times daily as needed for cough.  Marland Kitchen HYDROcodone-homatropine (HYCODAN) 5-1.5 MG/5ML syrup Take 5 mLs by mouth every 6 (six) hours as needed for cough.  . predniSONE (DELTASONE) 10 MG tablet Take 4 tablets daily for 5 days then stop  . [DISCONTINUED] desonide (DESOWEN) 0.05 % cream Apply topically 2 (two) times daily.  . [DISCONTINUED] FLUoxetine (PROZAC) 20 MG capsule TAKE ONE CAPSULE BY MOUTH DAILY  . [DISCONTINUED] simvastatin (ZOCOR) 40 MG tablet Take 40 mg by mouth every evening.

## 2013-11-12 NOTE — Patient Instructions (Signed)
Stay on symbicort two puff twice daily Prednisone 10mg  Take 4 tablets daily for 5 days then stop Stop Tudorza Use albuterol as needed Allergy labs today Return 6 weeks

## 2013-11-13 LAB — ALLERGY FULL PROFILE
Allergen, D pternoyssinus,d7: 2.04 kU/L — ABNORMAL HIGH
Allergen,Goose feathers, e70: <0.1 kU/L
Aspergillus fumigatus, m3: <0.1 kU/L
Bermuda Grass: 0.15 kU/L — ABNORMAL HIGH
Box Elder IgE: 0.17 kU/L — ABNORMAL HIGH
Candida Albicans: <0.1 kU/L
Dog Dander: 1.99 kU/L — ABNORMAL HIGH
Fescue: <0.1 kU/L
G005 Rye, Perennial: <0.1 kU/L
Helminthosporium halodes: <0.1 kU/L
IgE (Immunoglobulin E), Serum: 99 IU/mL (ref 0.0–180.0)
Oak: 1.05 kU/L — ABNORMAL HIGH
Stemphylium Botryosum: <0.1 kU/L
Timothy Grass: <0.1 kU/L

## 2013-11-18 ENCOUNTER — Encounter: Payer: Self-pay | Admitting: Critical Care Medicine

## 2013-11-18 NOTE — Progress Notes (Signed)
Quick Note:  Call pt and tell him labs show severe allergies. Will give him a copy at next OV  You can tell him the ones in yellow are all positive ______

## 2013-11-18 NOTE — Progress Notes (Signed)
Quick Note:  Called, spoke with pt. Informed him of lab results per Dr. Delford Field. He verbalized understanding. ______

## 2013-12-22 IMAGING — CR DG CHEST 2V
2 series · 2 of 2 positions shown · non-contrast
Comparison: Report of previous chest radiograph March 30, 2004
available ; actual images from that study are not available.

CLINICAL DATA: Cough

EXAM:
CHEST  2 VIEW

[w chest pa]
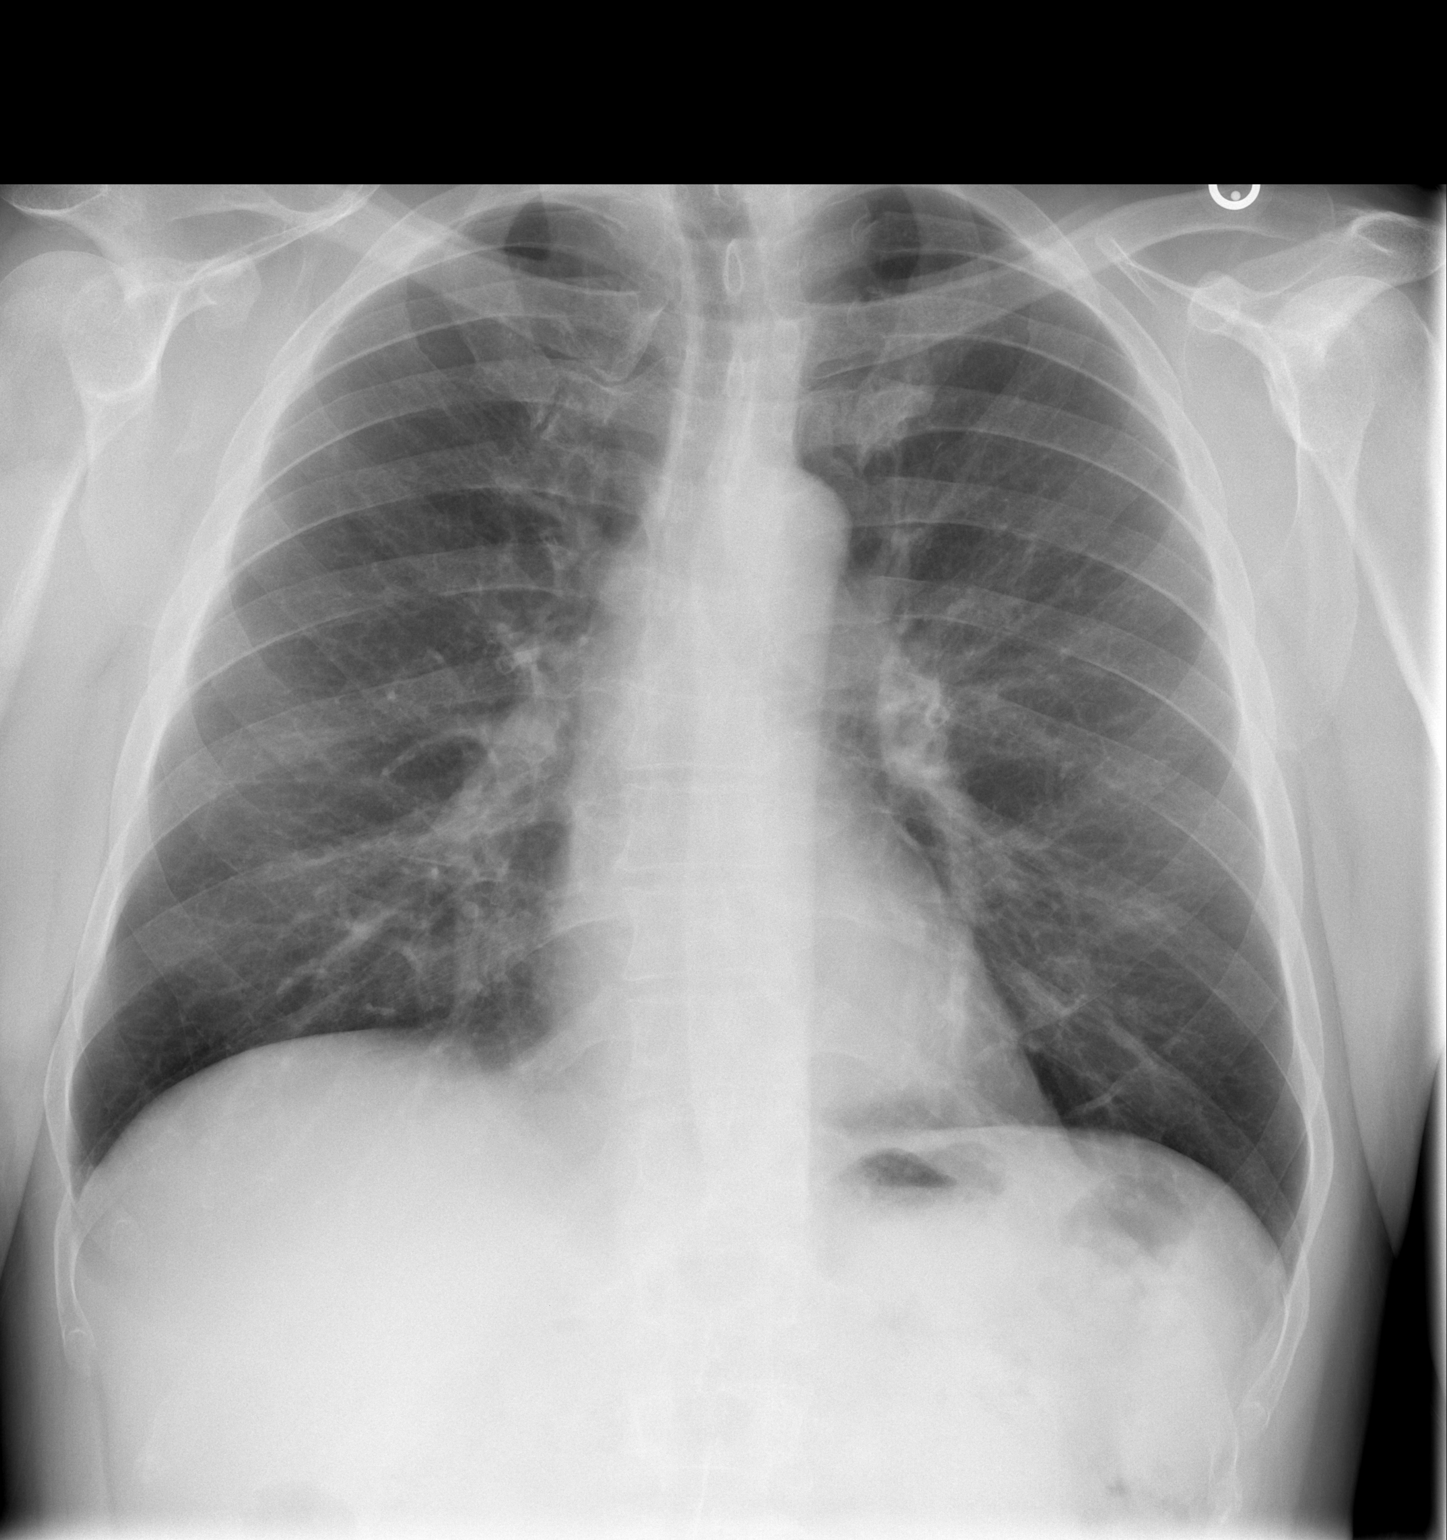

[w chest lat]
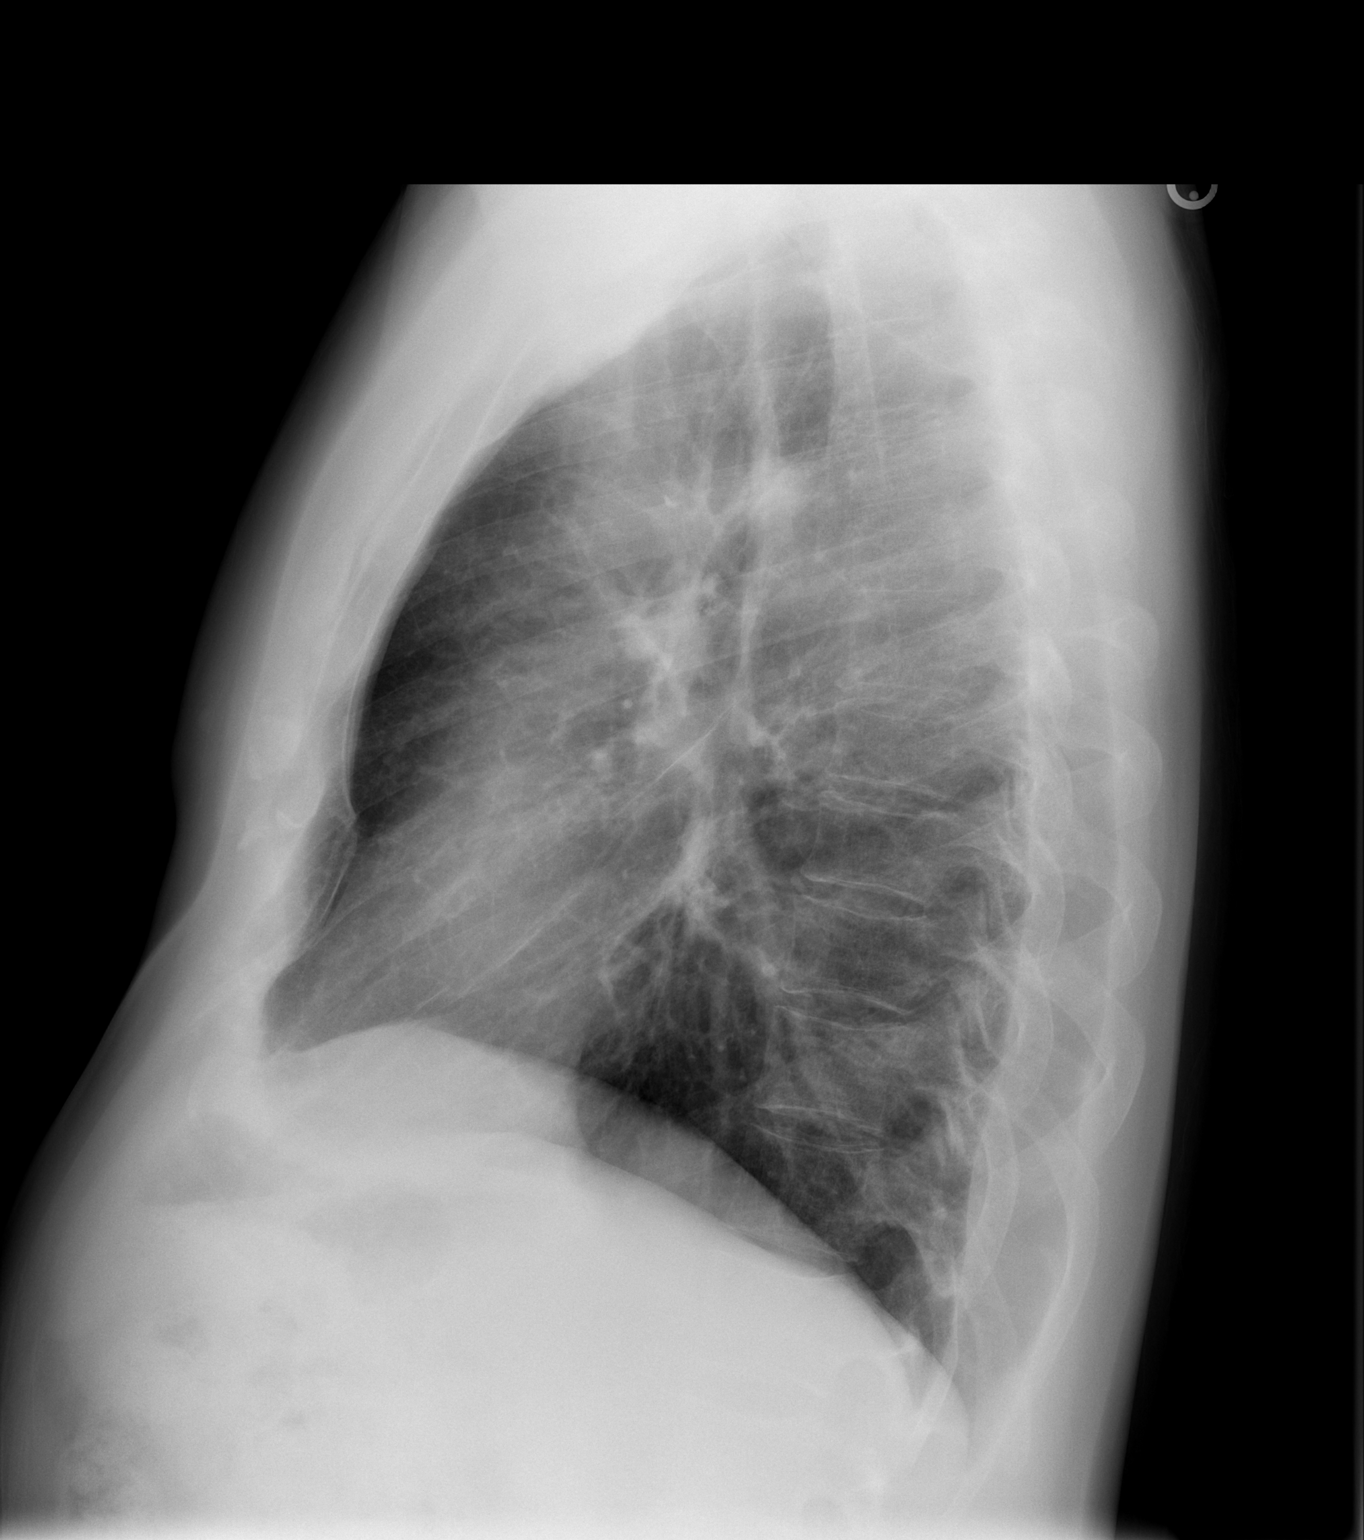

[2 of 2 positions shown; findings below may reference images not displayed]

FINDINGS: There is the flattening of the hemidiaphragms suggesting a degree of
underlying emphysematous change. There is no edema or consolidation.
The heart size is normal. Pulmonary vascularity reflects underlying
emphysema. No adenopathy. There is upper thoracic levoscoliosis.
IMPRESSION: There is a degree of underlying emphysematous change. No edema or
consolidation.

## 2013-12-24 ENCOUNTER — Encounter: Payer: Self-pay | Admitting: Critical Care Medicine

## 2013-12-24 ENCOUNTER — Ambulatory Visit (INDEPENDENT_AMBULATORY_CARE_PROVIDER_SITE_OTHER): Payer: Private Health Insurance - Indemnity | Admitting: Critical Care Medicine

## 2013-12-24 VITALS — BP 116/74 | HR 79 | Temp 97.6°F | Ht 68.0 in | Wt 172.0 lb

## 2013-12-24 DIAGNOSIS — J4541 Moderate persistent asthma with (acute) exacerbation: Secondary | ICD-10-CM

## 2013-12-24 DIAGNOSIS — J309 Allergic rhinitis, unspecified: Secondary | ICD-10-CM

## 2013-12-24 DIAGNOSIS — J45901 Unspecified asthma with (acute) exacerbation: Secondary | ICD-10-CM

## 2013-12-24 DIAGNOSIS — J45909 Unspecified asthma, uncomplicated: Secondary | ICD-10-CM

## 2013-12-24 MED ORDER — CHLORPHENIRAMINE TANNATE 12 MG PO TABS: 12.0000 mg | ORAL_TABLET | Freq: Every day | ORAL | Status: DC

## 2013-12-24 MED ORDER — ALBUTEROL SULFATE HFA 108 (90 BASE) MCG/ACT IN AERS: 2.0000 | INHALATION_SPRAY | Freq: Four times a day (QID) | RESPIRATORY_TRACT | Status: DC | PRN

## 2013-12-24 MED ORDER — FLUTICASONE PROPIONATE 50 MCG/ACT NA SUSP
2.0000 | Freq: Every day | NASAL | Status: DC
Start: 2013-12-24 — End: 2013-12-24

## 2013-12-24 MED ORDER — AMOXICILLIN-POT CLAVULANATE 875-125 MG PO TABS: 1.0000 | ORAL_TABLET | Freq: Two times a day (BID) | ORAL | Status: DC

## 2013-12-24 MED ORDER — FLUTICASONE PROPIONATE 50 MCG/ACT NA SUSP: 2.0000 | Freq: Every day | NASAL | Status: DC

## 2013-12-24 MED ORDER — PREDNISONE 10 MG PO TABS: ORAL_TABLET | ORAL | Status: DC

## 2013-12-24 NOTE — Progress Notes (Signed)
Subjective:    Patient ID: John Chandler, male    DOB: [birth year], 58 y.o.   MRN: [id number]  HPI Comments: Chronic cough for 3months, worse   Symptoms years previously and was on albuterol for same   12/24/2013 Chief Complaint  Patient presents with  . Follow-up    Pt c/o nonprod cough, no better or worse since lv.    Cyclical cough likely due to lower airway inflammation  Note air trapping seen on chest x-ray and pulmonary function show moderate obstruction  Doubt significant reflux factors playing a role  Plan  Stay on symbicort two puff twice daily  Prednisone 10mg  Take 4 tablets daily for 5 days then stop  Stop Tudorza  Use albuterol as needed  Cough the same. Still dyspneic. Uses the albuterol prn. Not much difference. IgE 99.  mulitple pos allergies.  No pndrip. Nose not an issue.  Now is stopped up.   Now some nasal pressure. Cough is mostly dry   Past Medical History  Diagnosis Date  . Acid reflux   . Allergy   . Genital herpes   . Asthma   . Type II or unspecified type diabetes mellitus without mention of complication, uncontrolled   . Headache   . Low testosterone   . Pneumonia 09/2011  . Restless leg syndrome   . Hyperlipidemia   . Cough      Family History  Problem Relation Age of Onset  . Diabetes Mother   . Heart disease Father   . Thyroid disease Sister   . Diabetes Maternal Grandmother   . COPD Mother      History   Social History  . Marital Status: Married    Spouse Name: N/A    Number of Children: N/A  . Years of Education: N/A   Occupational History  . [employer]    Social History Main Topics  . Smoking status: Never Smoker   . Smokeless tobacco: Never Used  . Alcohol Use: No  . Drug Use: No  . Sexual Activity: Yes   Other Topics Concern  . 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    Hobbies:  Reading, Working on Projects      No Known Allergies   Outpatient Prescriptions Prior to Visit  Medication Sig Dispense Refill  . budesonide-formoterol (SYMBICORT) 160-4.5 MCG/ACT inhaler Inhale 2 puffs into the lungs 2 (two) times daily.  1 Inhaler  11  . Canagliflozin (INVOKANA) 300 MG TABS Take 1 tablet (300 mg total) by mouth daily.  30 tablet  5  . escitalopram (LEXAPRO) 10 MG tablet Take 1/2 - 1 tab po q day  30 tablet  3  . glucose blood (FREESTYLE LITE) test strip Check sugar daily as directed.  100 each  3  . HYDROcodone-homatropine (HYCODAN) 5-1.5 MG/5ML syrup Take 5 mLs by mouth every 6 (six) hours as needed for cough.  240 mL  0  . Omeprazole (PRILOSEC PO) Take 20 mg by mouth as needed.       .Marland Kitchen  rOPINIRole (REQUIP) 2 MG tablet Take 2 mg by mouth at bedtime as needed.       . rosuvastatin (CRESTOR) 40 MG tablet Take 10-20 mg by mouth daily. As directed      . selenium sulfide (SELSUN) 2.5 % shampoo Apply to skin and let stand for 15 mins before rinsing.  120 mL  11  . sitaGLIPtin-metformin (JANUMET) 50-1000 MG per tablet Take 1 tablet by mouth 2 (two) times daily with a meal.  60 tablet  5  . topiramate (TOPAMAX) 100 MG tablet Take 100 mg by mouth daily.       . valACYclovir (VALTREX) 1000 MG tablet Take 1 tablet (1,000 mg total) by mouth 2 (two) times daily. PRN  30 tablet  0  . albuterol (PROVENTIL HFA;VENTOLIN HFA) 108 (90 BASE) MCG/ACT inhaler Inhale 2 puffs into the lungs every 6 (six) hours as needed for wheezing.      . benzonatate (TESSALON) 200 MG capsule Take 1 capsule (200 mg total) by mouth 3 (three) times daily as needed for cough.  60 capsule  0  . predniSONE (DELTASONE) 10 MG tablet Take 4 tablets daily for  5 days then stop  20 tablet  0   No facility-administered medications prior to visit.      Review of Systems  Constitutional: Negative for diaphoresis, activity change, appetite change, fatigue and unexpected weight change.  HENT: Positive for congestion. Negative for dental problem, ear discharge, facial swelling, hearing loss, mouth sores, nosebleeds, sinus pressure, sneezing, tinnitus, trouble swallowing and voice change.   Eyes: Negative for photophobia, discharge, itching and visual disturbance.  Respiratory: Negative for apnea, choking, chest tightness and stridor.   Cardiovascular: Negative for palpitations and leg swelling.  Gastrointestinal: Negative for nausea, vomiting, abdominal pain, constipation, blood in stool and abdominal distention.  Genitourinary: Negative for dysuria, urgency, frequency, hematuria, flank pain, decreased urine volume and difficulty urinating.  Musculoskeletal: Negative for arthralgias, back pain, gait problem, joint swelling, neck pain and neck stiffness.  Skin: Negative for color change and pallor.  Neurological: Negative for dizziness, tremors, seizures, syncope, speech difficulty, weakness, light-headedness and numbness.  Hematological: Negative for adenopathy. Does not bruise/bleed easily.  Psychiatric/Behavioral: Negative for confusion, sleep disturbance and agitation. The patient is not nervous/anxious.        Objective:   Physical Exam  Filed Vitals:   12/24/13 1128  BP: 116/74  Pulse: 79  Temp: 97.6 F (36.4 C)  TempSrc: Oral  Height: 5\' 8"  (1.727 m)  Weight: 172 lb (78.019 kg)  SpO2: 97%    Gen: Pleasant, well-nourished, in no distress,  normal affect  ENT: No lesions,  mouth clear,  oropharynx clear, no postnasal drip  Neck: No JVD, no TMG, no carotid bruits  Lungs: No use of accessory muscles, no dullness to percussion, distant breath sounds with expired wheezes  Cardiovascular: RRR, heart sounds normal, no murmur or  gallops, no peripheral edema  Abdomen: soft and NT, no HSM,  BS normal  Musculoskeletal: No deformities, no cyanosis or clubbing  Neuro: alert, non focal  Skin: Warm, no lesions or rashes  No results found.       Assessment & Plan:   Moderate persistent asthma with exacerbation Severe persistent asthma with cyclical cough and significant atopic features with multiple positive environmental allergens as a trigger Ongoing flare Note patient is using a [place] rescue inhaler which has salmeterol and which is a long-acting dilator not advised the patient not to use this device any further  Plan A referral to allergy will be made Prednisone 10mg  Take 4 for three days 3 for three days 2 for three days 1 for three days and stop Augmentin one twice daily sent to pharmacy downstairs Work on [employer] technique Stop canadian rescue inhaler, get ventolin /proventil as needed sent downstairs Flonase two puff twice daily each nostril Chlorpheniramine 12mg  bedtime Return 2 months     Updated Medication List Outpatient Encounter Prescriptions as of 12/24/2013  Medication Sig  . albuterol (PROVENTIL HFA;VENTOLIN HFA) 108 (90 BASE) MCG/ACT inhaler Inhale 2 puffs into the lungs every 6 (six) hours as needed for wheezing.  . budesonide-formoterol (SYMBICORT) 160-4.5 MCG/ACT inhaler Inhale 2 puffs into the lungs 2 (two) times daily.  . Canagliflozin (INVOKANA) 300 MG TABS Take 1 tablet (300 mg total) by mouth daily.  Marland Kitchen escitalopram (LEXAPRO) 10 MG tablet Take 1/2 - 1 tab po q day  . glucose blood (FREESTYLE LITE) test strip Check sugar daily as directed.  Marland Kitchen HYDROcodone-homatropine (HYCODAN) 5-1.5 MG/5ML syrup Take 5 mLs by mouth every 6 (six) hours as needed for cough.  . Omeprazole (PRILOSEC PO) Take 20 mg by mouth as needed.   Marland Kitchen rOPINIRole (REQUIP) 2 MG tablet Take 2 mg by mouth at bedtime as needed.   . rosuvastatin (CRESTOR) 40 MG tablet Take 10-20 mg by mouth daily. As directed  .  selenium sulfide (SELSUN) 2.5 % shampoo Apply to skin and let stand for 15 mins before rinsing.  . sitaGLIPtin-metformin (JANUMET) 50-1000 MG per tablet Take 1 tablet by mouth 2 (two) times daily with a meal.  . topiramate (TOPAMAX) 100 MG tablet Take 100 mg by mouth daily.   . valACYclovir (VALTREX) 1000 MG tablet Take 1 tablet (1,000 mg total) by mouth 2 (two) times daily. PRN  . [DISCONTINUED] albuterol (PROVENTIL HFA;VENTOLIN HFA) 108 (90 BASE) MCG/ACT inhaler Inhale 2 puffs into the lungs every 6 (six) hours as needed for wheezing.  Marland Kitchen amoxicillin-clavulanate (AUGMENTIN) 875-125 MG per tablet Take 1 tablet by mouth 2 (two) times daily.  . Chlorpheniramine Tannate 12 MG TABS Take 1 tablet (12 mg total) by mouth at bedtime.  . fluticasone (FLONASE) 50 MCG/ACT nasal spray Place 2 sprays into both nostrils daily.  . predniSONE (DELTASONE) 10 MG tablet Take 4 for three days 3 for three days 2 for three days 1 for three days and stop  . [DISCONTINUED] benzonatate (TESSALON) 200 MG capsule Take 1 capsule (200 mg total) by mouth 3 (three) times daily as needed for cough.  . [DISCONTINUED] fluticasone (FLONASE) 50 MCG/ACT nasal spray Place 2 sprays into both nostrils daily.  . [DISCONTINUED] predniSONE (DELTASONE) 10 MG tablet Take 4 tablets daily for 5 days then stop

## 2013-12-24 NOTE — Patient Instructions (Addendum)
A referral to allergy will be made Prednisone 10mg  Take 4 for three days 3 for three days 2 for three days 1 for three days and stop Augmentin one twice daily sent to pharmacy downstairs Work on [employer] technique Stop canadian rescue inhaler, get ventolin /proventil as needed sent downstairs Flonase two puff twice daily each nostril Chlorpheniramine 12mg  bedtime Return 2 months

## 2013-12-25 DIAGNOSIS — J302 Other seasonal allergic rhinitis: Secondary | ICD-10-CM | POA: Insufficient documentation

## 2013-12-25 DIAGNOSIS — J3089 Other allergic rhinitis: Secondary | ICD-10-CM

## 2013-12-25 NOTE — Assessment & Plan Note (Signed)
Severe persistent asthma with cyclical cough and significant atopic features with multiple positive environmental allergens as a trigger Ongoing flare Note patient is using a [place] rescue inhaler which has salmeterol and which is a long-acting dilator not advised the patient not to use this device any further Plan A referral to allergy will be made Prednisone 10mg  Take 4 for three days 3 for three days 2 for three days 1 for three days and stop Augmentin one twice daily sent to pharmacy downstairs Work on [employer]ymb[employer] technique Stop canadian rescue inhaler, get ventolin /proventil as needed sent downstairs Flonase two puff twice daily each nostril Chlorpheniramine 12mg  bedtime Return 2 months

## 2014-01-09 LAB — HM DIABETES EYE EXAM

## 2014-01-10 ENCOUNTER — Encounter: Payer: Self-pay | Admitting: *Deleted

## 2014-01-22 ENCOUNTER — Encounter: Payer: Self-pay | Admitting: Critical Care Medicine

## 2014-01-22 NOTE — Telephone Encounter (Deleted)
Dr Delford FieldWright, did you want to set him up to see Dr Maple HudsonYoung? Please advise thanks!

## 2014-01-22 NOTE — Telephone Encounter (Signed)
Spoke with the pt and scheduled allergy consult with Dr Maple HudsonYoung for 02/25/14 at 9:15 am

## 2014-02-12 ENCOUNTER — Encounter: Payer: Self-pay | Admitting: Critical Care Medicine

## 2014-02-18 ENCOUNTER — Ambulatory Visit: Payer: Private Health Insurance - Indemnity | Admitting: Critical Care Medicine

## 2014-02-25 ENCOUNTER — Ambulatory Visit (INDEPENDENT_AMBULATORY_CARE_PROVIDER_SITE_OTHER): Payer: Private Health Insurance - Indemnity | Admitting: Internal Medicine

## 2014-02-25 ENCOUNTER — Encounter: Payer: Self-pay | Admitting: Internal Medicine

## 2014-02-25 VITALS — BP 130/76 | HR 96 | Ht 68.0 in | Wt 168.8 lb

## 2014-02-25 DIAGNOSIS — J45901 Unspecified asthma with (acute) exacerbation: Secondary | ICD-10-CM

## 2014-02-25 DIAGNOSIS — J309 Allergic rhinitis, unspecified: Secondary | ICD-10-CM

## 2014-02-25 DIAGNOSIS — J4541 Moderate persistent asthma with (acute) exacerbation: Secondary | ICD-10-CM

## 2014-02-25 NOTE — Assessment & Plan Note (Signed)
Has done appropriate environmental modifications, except has 2 cats. Specific IgE relatively high for cat. Effectiveness of his management approaches unclear. Allergic asthma seems likely major cause of cough. Plan - Allergy skin testing to see if allergy vaccine or Xolair would be a consideration.

## 2014-02-25 NOTE — Progress Notes (Signed)
02/25/14- [age] yoM never smoker referred courtesy of Dr Delford FieldWright-? allergies causing cough. Labs were done. Perennial cough x many years. Controlled best with asthma inhalers- Albuterol and Symbicort. Remote singulair no help. Denies seasonal, but triggers include cold weather. Mild nasal stuffiness- no polyps. As a child was allergic to cats. Now has 2 cats- doesn't recognize a problem.  Environment- house, no mold/ dampness. No smokers. 2 cats. +encasings on bedding, +HEPA filter. Uses AllerPet on the cats and an anti-allergy spray on walls and furniture. Home office. No problems with foods, skin rashes, insect stings, aspirin. Mother smoker-COPD. Allergy Profile 11/12/13- Total IgE 99.0, elevations for cat, dog, dust mite, grass pollens, tree pollens, ragweed.   Prior to Admission medications   Medication Sig Start Date End Date Taking? Authorizing Provider  albuterol (PROVENTIL HFA;VENTOLIN HFA) 108 (90 BASE) MCG/ACT inhaler Inhale 2 puffs into the lungs every 6 (six) hours as needed for wheezing. 12/24/13  Yes Storm FriskPatrick E Wright, MD  budesonide-formoterol [facility](SYMBICORT) 160-4.5 MCG/ACT inhaler Inhale 2 puffs into the lungs 2 (two) times daily. 08/23/13 08/23/14 Yes Gillian Scarceobyn K Zanard, MD  Canagliflozin (INVOKANA) 300 MG TABS Take 1 tablet (300 mg total) by mouth daily. 08/23/13 08/23/14 Yes Gillian Scarceobyn K Zanard, MD  escitalopram (LEXAPRO) 10 MG tablet Take 1/2 - 1 tab po q day 08/23/13 08/23/14 Yes Gillian Scarceobyn K Zanard, MD  glucose blood (FREESTYLE LITE) test strip Check sugar daily as directed. 08/23/13 08/23/14 Yes Gillian Scarceobyn K Zanard, MD  Omeprazole (PRILOSEC PO) Take 20 mg by mouth as needed.    Yes Historical Provider, MD  rOPINIRole (REQUIP) 2 MG tablet Take 2 mg by mouth at bedtime as needed.    Yes Historical Provider, MD  rosuvastatin (CRESTOR) 40 MG tablet Take 20 mg by mouth daily. As directed 08/23/13  Yes Gillian Scarceobyn K Zanard, MD  selenium sulfide (SELSUN) 2.5 % shampoo Apply to skin and let stand for 15 mins before rinsing.  08/23/13 08/23/14 Yes Gillian Scarceobyn K Zanard, MD  sitaGLIPtin-metformin (JANUMET) 50-1000 MG per tablet Take 1 tablet by mouth 2 (two) times daily with a meal. 08/23/13 08/23/14 Yes Gillian Scarceobyn K Zanard, MD  topiramate (TOPAMAX) 100 MG tablet Take 100 mg by mouth daily.  07/30/13  Yes Historical Provider, MD   Past Medical History  Diagnosis Date  . Acid reflux   . Allergy   . Genital herpes   . Asthma   . Type II or unspecified type diabetes mellitus without mention of complication, uncontrolled   . Headache   . Low testosterone   . Pneumonia 09/2011  . Restless leg syndrome   . Hyperlipidemia   . Cough    Past Surgical History  Procedure Laterality Date  . Appendectomy     Family History  Problem Relation Age of Onset  . Diabetes Mother   . Heart disease Father   . Thyroid disease Sister   . Diabetes Maternal Grandmother   . COPD Mother    History   Social History  . Marital Status: Married    Spouse Name: N/A    Number of Children: 0  . Years of Education: N/A   Occupational History  . [employer]    Social History Main Topics  . Smoking status: Never Smoker   . Smokeless tobacco: Never Used  . Alcohol Use: No  . Drug Use: No  . Sexual Activity: Yes   Other Topics Concern  . Not on file   Social History Narrative   Marital Status: Married Clydie Braun(Karen)  Children:  None    Pets:  Cats (2); Dog (1)    Living Situation: Lives with spouse   Occupation: Civil engineer, contracting Transport planner)   Education:  Engineer, agricultural - Water quality scientist   Tobacco Use/Exposure:  None    Alcohol Use:  None   Drug Use:  None   Diet:  Regular   Exercise:  He has started walking more on his treadmill.    Hobbies:  Reading, Working on Projects    ROS-see HPI Constitutional:   No-   weight loss, night sweats, fevers, chills, fatigue, lassitude. HEENT:   No-  headaches, difficulty swallowing, tooth/dental problems, sore throat,       No-  sneezing, itching, ear ache, nasal  congestion, post nasal drip,  CV:  No-   chest pain, orthopnea, PND, swelling in lower extremities, anasarca,                                  dizziness, palpitations Resp: No-   shortness of breath with exertion or at rest.              No-   productive cough,  + non-productive cough,  No- coughing up of blood.              No-   change in color of mucus.  No- wheezing.   Skin: No-   rash or lesions. GI:  No-   heartburn, indigestion, abdominal pain, nausea, vomiting, diarrhea,                 change in bowel habits, loss of appetite GU: No-   dysuria, change in color of urine, no urgency or frequency.  No- flank pain. MS:  No-   joint pain or swelling.  No- decreased range of motion.  No- back pain. Neuro-     nothing unusual Psych:  No- change in mood or affect. No depression or anxiety.  No memory loss.  OBJ- Physical Exam  Medium build General- Alert, Oriented, Affect-appropriate, Distress- none acute Skin- rash-none, lesions- none, excoriation- none Lymphadenopathy- none Head- atraumatic            Eyes- Gross vision intact, PERRLA, conjunctivae and secretions clear            Ears- Hearing, canals-normal            Nose- Clear, no-Septal dev, mucus+watery clear, No-polyps, erosion, perforation             Throat- Mallampati II , mucosa+cobbletone , drainage- none, tonsils- atrophic Neck- flexible , trachea midline, no stridor , thyroid nl, carotid no bruit Chest - symmetrical excursion , unlabored           Heart/CV- RRR , no murmur , no gallop  , no rub, nl s1 s2                           - JVD- none , edema- none, stasis changes- none, varices- none           Lung- clear to P&A, wheeze- none, cough+with deep breath , dullness-none, rub- none           Chest wall-  Abd- tender-no, distended-no, bowel sounds-present, HSM- no Br/ Gen/ Rectal- Not done, not indicated Extrem- cyanosis- none, clubbing, none, atrophy- none, strength- nl Neuro- grossly intact to observation

## 2014-02-25 NOTE — Patient Instructions (Signed)
Schedule Allergy skin testing- need to be off antihistamines for 3 days.

## 2014-02-26 ENCOUNTER — Ambulatory Visit (INDEPENDENT_AMBULATORY_CARE_PROVIDER_SITE_OTHER): Payer: Private Health Insurance - Indemnity | Admitting: Internal Medicine

## 2014-02-26 ENCOUNTER — Encounter: Payer: Self-pay | Admitting: Internal Medicine

## 2014-02-26 VITALS — BP 118/66 | HR 77 | Ht 68.0 in | Wt 168.4 lb

## 2014-02-26 DIAGNOSIS — J4541 Moderate persistent asthma with (acute) exacerbation: Secondary | ICD-10-CM

## 2014-02-26 DIAGNOSIS — J309 Allergic rhinitis, unspecified: Secondary | ICD-10-CM

## 2014-02-26 DIAGNOSIS — J45901 Unspecified asthma with (acute) exacerbation: Secondary | ICD-10-CM

## 2014-02-26 MED ORDER — OMALIZUMAB 150 MG ~~LOC~~ SOLR: SUBCUTANEOUS | Status: DC

## 2014-02-26 NOTE — Progress Notes (Signed)
02/25/14- [age] yoM never smoker referred courtesy of Dr Delford FieldWright-? allergies causing cough. Labs were done. Perennial cough x many years. Controlled best with asthma inhalers- Albuterol and Symbicort. Remote singulair no help. Denies seasonal, but triggers include cold weather. Mild nasal stuffiness- no polyps. As a child was allergic to cats. Now has 2 cats- doesn't recognize a problem.  Environment- house, no mold/ dampness. No smokers. 2 cats. +encasings on bedding, +HEPA filter. Uses AllerPet on the cats and an anti-allergy spray on walls and furniture. Home office. No problems with foods, skin rashes, insect stings, aspirin. Mother smoker-COPD. Allergy Profile 11/12/13- Total IgE 99.0, elevations for cat, dog, dust mite, grass pollens, tree pollens, ragweed.   02/26/14- [age] yoM never smoker referred courtesy of Dr Delford FieldWright-? allergies causing cough. Labs were done. No antihistamines, OTC cough syrups, or OTC sleep aids. Here today for allergy skin testing. No acute changes. Allergy skin test 02/26/14- Positive broadly for grass, weed and tree pollens, dust mite, cat and dog.  We discussed allergy vaccine compared with Xolair. He can work more easily with the schedule for Xolair, calculated at 150 mg every 4 weeks ( IgE 99 on 11/12/13, Weight 168 lbs).  ROS-see HPI Constitutional:   No-   weight loss, night sweats, fevers, chills, fatigue, lassitude. HEENT:   No-  headaches, difficulty swallowing, tooth/dental problems, sore throat,       No-  sneezing, itching, ear ache, nasal congestion, post nasal drip,  CV:  No-   chest pain, orthopnea, PND, swelling in lower extremities, anasarca,                                  dizziness, palpitations Resp: No-   shortness of breath with exertion or at rest.              No-   productive cough,  + non-productive cough,  No- coughing up of blood.              No-   change in color of mucus. + little wheezing.   Skin: No-   rash or lesions. GI:  No-   heartburn,  indigestion, abdominal pain, nausea, vomiting,  GU:  MS:  No-   joint pain or swelling.   Neuro-     nothing unusual Psych:  No- change in mood or affect. No depression or anxiety.  No memory loss.  OBJ- Physical Exam  Medium build General- Alert, Oriented, Affect-appropriate, Distress- none acute Skin- rash-none, lesions- none, excoriation- none Lymphadenopathy- none Head- atraumatic            Eyes- Gross vision intact, PERRLA, conjunctivae and secretions clear            Ears- Hearing, canals-normal            Nose- Clear, no-Septal dev, mucus+watery clear, No-polyps, erosion, perforation             Throat- Mallampati II , mucosa+cobblestone , drainage- none, tonsils- atrophic Neck- flexible , trachea midline, no stridor , thyroid nl, carotid no bruit Chest - symmetrical excursion , unlabored           Heart/CV- RRR , no murmur , no gallop  , no rub, nl s1 s2                           - JVD- none , edema- none, stasis changes-  none, varices- none           Lung- clear to P&A, wheeze+trace mid back, cough+with deep breath , dullness-none, rub- none           Chest wall-  Abd-  Br/ Gen/ Rectal- Not done, not indicated Extrem- cyanosis- none, clubbing, none, atrophy- none, strength- nl Neuro- grossly intact to observation

## 2014-02-26 NOTE — Patient Instructions (Signed)
We will start the process of Xolair application so you can see what the cost issues look like for your insurance.

## 2014-02-26 NOTE — Assessment & Plan Note (Signed)
Significant atopic component based on in vitro and skin testing. He is already carefull about environmental precautions, except he has cats. We reviewed options presented by allergy test results and compared allergy shots with Xolair. He wishes to start Xolair.

## 2014-03-06 ENCOUNTER — Ambulatory Visit: Payer: Private Health Insurance - Indemnity | Admitting: Critical Care Medicine

## 2014-03-07 ENCOUNTER — Ambulatory Visit (INDEPENDENT_AMBULATORY_CARE_PROVIDER_SITE_OTHER): Payer: Private Health Insurance - Indemnity | Admitting: Critical Care Medicine

## 2014-03-07 ENCOUNTER — Encounter: Payer: Self-pay | Admitting: Critical Care Medicine

## 2014-03-07 VITALS — BP 104/66 | HR 73 | Temp 98.1°F | Ht 68.0 in | Wt 168.2 lb

## 2014-03-07 DIAGNOSIS — J45901 Unspecified asthma with (acute) exacerbation: Secondary | ICD-10-CM

## 2014-03-07 DIAGNOSIS — J4541 Moderate persistent asthma with (acute) exacerbation: Secondary | ICD-10-CM

## 2014-03-07 NOTE — Patient Instructions (Signed)
No change in medications. Return in    4 months Await getting xolair started

## 2014-03-07 NOTE — Progress Notes (Signed)
Subjective:    Patient ID: John Chandler, male    DOB: February 28, 1956, 58 y.o.   MRN: [id number]  HPI Comments: Chronic cough for 3months, worse   Symptoms years previously and was on albuterol for same     03/07/2014 Chief Complaint  Patient presents with  . 2 month follow up    Breathing and cough are unchanged.  Cough is nonprod.  Wheezing at times.    Pt saw allergy MD At last pulm ov we rec: Prednisone 10mg  Take 4 for three days 3 for three days 2 for three days 1 for three days and stop Augmentin one twice daily sent to pharmacy downstairs Work on [employer] technique Stop canadian rescue inhaler, get ventolin /proventil as needed sent downstairs Flonase two puff twice daily each nostril Chlorpheniramine 12mg  bedtime   No real change in symptoms.  While on pred was ok, then gradually started .  No real pndrip     Cough is dry  PUL ASTHMA HISTORY 03/07/2014  Symptoms Daily  Nighttime awakenings 0-2/month  Interference with activity Minor limitations  SABA use 0-2 days/wk  Exacerbations requiring oral steroids 2 or more / year    Past Medical History  Diagnosis Date  . Acid reflux   . Allergy   . Genital herpes   . Asthma   . Type II or unspecified type diabetes mellitus without mention of complication, uncontrolled   . Headache   . Low testosterone   . Pneumonia 09/2011  . Restless leg syndrome   . Hyperlipidemia   . Cough      Family History  Problem Relation Age of Onset  . Diabetes Mother   . Heart disease Father   . Thyroid disease Sister   . Diabetes Maternal Grandmother   . COPD Mother      History   Social History  . Marital Status: Married    Spouse Name: N/A    Number of Children: 0  . Years of Education: N/A   Occupational History  . [employer]    Social History Main Topics  . Smoking status: Never Smoker   . Smokeless tobacco: Never Used  . Alcohol Use: No  . Drug Use: No  . Sexual Activity: Yes   Other Topics Concern  . Not  on file   Social History Narrative   Marital Status: Married Clydie Braun)   Children:  None    Pets:  Cats (2); Dog (1)    Living Situation: Lives with spouse   Occupation: Civil engineer, contracting Transport planner)   Education:  Engineer, agricultural - Water quality scientist   Tobacco Use/Exposure:  None    Alcohol Use:  None   Drug Use:  None   Diet:  Regular   Exercise:  He has started walking more on his treadmill.    Hobbies:  Reading, Working on Projects      No Known Allergies   Outpatient Prescriptions Prior to Visit  Medication Sig Dispense Refill  . albuterol (PROVENTIL HFA;VENTOLIN HFA) 108 (90 BASE) MCG/ACT inhaler Inhale 2 puffs into the lungs every 6 (six) hours as needed for wheezing.  1 Inhaler  6  . budesonide-formoterol (SYMBICORT) 160-4.5 MCG/ACT inhaler Inhale 2 puffs into the lungs 2 (two) times daily.  1 Inhaler  11  . Canagliflozin (INVOKANA) 300 MG TABS Take 1 tablet (300 mg total) by mouth daily.  30 tablet  5  . escitalopram (LEXAPRO) 10 MG tablet Take 1/2 - 1 tab po q  day  30 tablet  3  . glucose blood (FREESTYLE LITE) test strip Check sugar daily as directed.  100 each  3  . Omeprazole (PRILOSEC PO) Take 20 mg by mouth as needed.       Marland Kitchen rOPINIRole (REQUIP) 2 MG tablet Take 2 mg by mouth at bedtime as needed.       . rosuvastatin (CRESTOR) 40 MG tablet Take 20 mg by mouth daily. As directed      . selenium sulfide (SELSUN) 2.5 % shampoo Apply to skin and let stand for 15 mins before rinsing.  120 mL  11  . sitaGLIPtin-metformin (JANUMET) 50-1000 MG per tablet Take 1 tablet by mouth 2 (two) times daily with a meal.  60 tablet  5  . topiramate (TOPAMAX) 100 MG tablet Take 100 mg by mouth daily.       Marland Kitchen omalizumab (XOLAIR) 150 MG injection 150 mg [place] every 4 weeks  150 each  prn   No facility-administered medications prior to visit.      Review of Systems  Constitutional: Negative for diaphoresis, activity change, appetite change, fatigue and unexpected weight  change.  HENT: Positive for congestion. Negative for dental problem, ear discharge, facial swelling, hearing loss, mouth sores, nosebleeds, sinus pressure, sneezing, tinnitus, trouble swallowing and voice change.   Eyes: Negative for photophobia, discharge, itching and visual disturbance.  Respiratory: Negative for apnea, choking, chest tightness and stridor.   Cardiovascular: Negative for palpitations and leg swelling.  Gastrointestinal: Negative for nausea, vomiting, abdominal pain, constipation, blood in stool and abdominal distention.  Genitourinary: Negative for dysuria, urgency, frequency, hematuria, flank pain, decreased urine volume and difficulty urinating.  Musculoskeletal: Negative for arthralgias, back pain, gait problem, joint swelling, neck pain and neck stiffness.  Skin: Negative for color change and pallor.  Neurological: Negative for dizziness, tremors, seizures, syncope, speech difficulty, weakness, light-headedness and numbness.  Hematological: Negative for adenopathy. Does not bruise/bleed easily.  Psychiatric/Behavioral: Negative for confusion, sleep disturbance and agitation. The patient is not nervous/anxious.        Objective:   Physical Exam  Filed Vitals:   03/07/14 1515  BP: 104/66  Pulse: 73  Temp: 98.1 F (36.7 C)  TempSrc: Oral  Height: 5\' 8"  (1.727 m)  Weight: 76.295 kg (168 lb 3.2 oz)  SpO2: 96%    Gen: Pleasant, well-nourished, in no distress,  normal affect  ENT: No lesions,  mouth clear,  oropharynx clear, no postnasal drip  Neck: No JVD, no TMG, no carotid bruits  Lungs: No use of accessory muscles, no dullness to percussion, distant breath sounds with decreased wheezes  Cardiovascular: RRR, heart sounds normal, no murmur or gallops, no peripheral edema  Abdomen: soft and NT, no HSM,  BS normal  Musculoskeletal: No deformities, no cyanosis or clubbing  Neuro: alert, non focal  Skin: Warm, no lesions or rashes  No results  found.       Assessment & Plan:   Moderate persistent asthma with exacerbation Moderate persistent asthma Significant atopy Plan No change in medications. Start Xolair ASAP    Updated Medication List Outpatient Encounter Prescriptions as of 03/07/2014  Medication Sig  . albuterol (PROVENTIL HFA;VENTOLIN HFA) 108 (90 BASE) MCG/ACT inhaler Inhale 2 puffs into the lungs every 6 (six) hours as needed for wheezing.  . budesonide-formoterol (SYMBICORT) 160-4.5 MCG/ACT inhaler Inhale 2 puffs into the lungs 2 (two) times daily.  . Canagliflozin (INVOKANA) 300 MG TABS Take 1 tablet (300 mg total) by mouth daily.  Marland Kitchen  escitalopram (LEXAPRO) 10 MG tablet Take 1/2 - 1 tab po q day  . glucose blood (FREESTYLE LITE) test strip Check sugar daily as directed.  . Omeprazole (PRILOSEC PO) Take 20 mg by mouth as needed.   Marland Kitchen. rOPINIRole (REQUIP) 2 MG tablet Take 2 mg by mouth at bedtime as needed.   . rosuvastatin (CRESTOR) 40 MG tablet Take 20 mg by mouth daily. As directed  . selenium sulfide (SELSUN) 2.5 % shampoo Apply to skin and let stand for 15 mins before rinsing.  . sitaGLIPtin-metformin (JANUMET) 50-1000 MG per tablet Take 1 tablet by mouth 2 (two) times daily with a meal.  . topiramate (TOPAMAX) 100 MG tablet Take 100 mg by mouth daily.   Marland Kitchen. omalizumab (XOLAIR) 150 MG injection 150 mg [place] every 4 weeks

## 2014-03-08 NOTE — Assessment & Plan Note (Signed)
Moderate persistent asthma Significant atopy Plan No change in medications. Start Xolair ASAP

## 2014-03-14 ENCOUNTER — Encounter: Payer: Self-pay | Admitting: Internal Medicine

## 2014-03-18 ENCOUNTER — Telehealth: Payer: Self-pay | Admitting: Internal Medicine

## 2014-03-18 MED ORDER — EPINEPHRINE 0.3 MG/0.3ML IJ SOAJ: 0.3000 mg | Freq: Once | INTRAMUSCULAR | Status: DC

## 2014-03-18 NOTE — Telephone Encounter (Signed)
I called and verified the call form Insurance company with the patient; he states he is aware of approval and payments he would be responsible for. Pt was made aware that the allergy lab would call him once Xolair medication comes in, aware of 2 hour wait time once given injection, as well as having a CURRENT epipen. Pt aware he must bring the epipen with him at his visit to get injection.   Rx for Epipen sent to pharmacy and patient is comfortable using as he uses one for his wife.    Pt will await a phone call from Allergy lab to schedule first injection.

## 2014-04-10 ENCOUNTER — Ambulatory Visit (INDEPENDENT_AMBULATORY_CARE_PROVIDER_SITE_OTHER): Payer: Private Health Insurance - Indemnity

## 2014-04-10 DIAGNOSIS — J45909 Unspecified asthma, uncomplicated: Secondary | ICD-10-CM | POA: Diagnosis not present

## 2014-04-10 MED ORDER — OMALIZUMAB 150 MG ~~LOC~~ SOLR
150.0000 mg | Freq: Once | SUBCUTANEOUS | Status: AC
  Administered 2014-04-10: 150 mg via SUBCUTANEOUS

## 2014-04-28 ENCOUNTER — Encounter: Payer: Self-pay | Admitting: Internal Medicine

## 2014-04-28 ENCOUNTER — Ambulatory Visit (INDEPENDENT_AMBULATORY_CARE_PROVIDER_SITE_OTHER): Payer: Private Health Insurance - Indemnity | Admitting: Internal Medicine

## 2014-04-28 VITALS — BP 112/74 | HR 69 | Ht 68.0 in | Wt 166.6 lb

## 2014-04-28 DIAGNOSIS — J3089 Other allergic rhinitis: Secondary | ICD-10-CM

## 2014-04-28 DIAGNOSIS — J45901 Unspecified asthma with (acute) exacerbation: Secondary | ICD-10-CM

## 2014-04-28 DIAGNOSIS — J309 Allergic rhinitis, unspecified: Secondary | ICD-10-CM

## 2014-04-28 DIAGNOSIS — J302 Other seasonal allergic rhinitis: Secondary | ICD-10-CM

## 2014-04-28 DIAGNOSIS — J4541 Moderate persistent asthma with (acute) exacerbation: Secondary | ICD-10-CM

## 2014-04-28 NOTE — Progress Notes (Signed)
02/25/14- 89 yoM never smoker referred courtesy of Dr Delford FieldWright-? allergies causing cough. Labs were done. Perennial cough x many years. Controlled best with asthma inhalers- Albuterol and Symbicort. Remote singulair no help. Denies seasonal, but triggers include cold weather. Mild nasal stuffiness- no polyps. As a child was allergic to cats. Now has 2 cats- doesn't recognize a problem.  Environment- house, no mold/ dampness. No smokers. 2 cats. +encasings on bedding, +HEPA filter. Uses AllerPet on the cats and an anti-allergy spray on walls and furniture. Home office. No problems with foods, skin rashes, insect stings, aspirin. Mother smoker-COPD. Allergy Profile 11/12/13- Total IgE 99.0, elevations for cat, dog, dust mite, grass pollens, tree pollens, ragweed.   02/26/14- [age] yoM never smoker referred courtesy of Dr Delford FieldWright-? allergies causing cough. Labs were done. No antihistamines, OTC cough syrups, or OTC sleep aids. Here today for allergy skin testing. No acute changes. Allergy skin test 02/26/14- Positive broadly for grass, weed and tree pollens, dust mite, cat and dog.  We discussed allergy vaccine compared with Xolair. He can work more easily with the schedule for Xolair, calculated at 150 mg every 4 weeks ( IgE 99 on 11/12/13, Weight 168 lbs).  04/28/14- [age] yoM never smoker referred courtesy of Dr Delford FieldWright-? allergies causing cough. Labs were done. No antihistamines, OTC cough syrups, or OTC sleep aids. FOLLOWS FOR:  Reports not coughing as much. Had first xolair shot x3 weeks ago and went well. Rare need for rescue inhaler. No recent acute events.  ROS-see HPI Constitutional:   No-   weight loss, night sweats, fevers, chills, fatigue, lassitude. HEENT:   No-  headaches, difficulty swallowing, tooth/dental problems, sore throat,       No-  sneezing, itching, ear ache, nasal congestion, post nasal drip,  CV:  No-   chest pain, orthopnea, PND, swelling in lower extremities, anasarca,                                   dizziness, palpitations Resp: No-   shortness of breath with exertion or at rest.              No-   productive cough,  + non-productive cough,  No- coughing up of blood.              No-   change in color of mucus. + little wheezing.   Skin: No-   rash or lesions. GI:  No-   heartburn, indigestion, abdominal pain, nausea, vomiting,  GU:  MS:  No-   joint pain or swelling.   Neuro-     nothing unusual Psych:  No- change in mood or affect. No depression or anxiety.  No memory loss.  OBJ- Physical Exam  Medium build General- Alert, Oriented, Affect-appropriate, Distress- none acute Skin- rash-none, lesions- none, excoriation- none Lymphadenopathy- none Head- atraumatic            Eyes- Gross vision intact, PERRLA, conjunctivae and secretions clear            Ears- Hearing, canals-normal            Nose- Clear, no-Septal dev, mucus+watery clear, No-polyps, erosion, perforation             Throat- Mallampati II , mucosa+cobblestone , drainage- none, tonsils- atrophic Neck- flexible , trachea midline, no stridor , thyroid nl, carotid no bruit Chest - symmetrical excursion , unlabored  Heart/CV- RRR , no murmur , no gallop  , no rub, nl s1 s2                           - JVD- none , edema- none, stasis changes- none, varices- none           Lung- clear to P&A, wheeze-none, cough-none , dullness-none, rub- none           Chest wall-  Abd-  Br/ Gen/ Rectal- Not done, not indicated Extrem- cyanosis- none, clubbing, none, atrophy- none, strength- nl Neuro- grossly intact to observation

## 2014-04-28 NOTE — Patient Instructions (Signed)
We will continue Xolair and watch. If you get to doing really well, you could see what happens if your reduce Symbicort to 1 puff, twice daily.  Please call if we can help

## 2014-05-09 ENCOUNTER — Ambulatory Visit (INDEPENDENT_AMBULATORY_CARE_PROVIDER_SITE_OTHER): Payer: Private Health Insurance - Indemnity

## 2014-05-09 DIAGNOSIS — J45909 Unspecified asthma, uncomplicated: Secondary | ICD-10-CM | POA: Diagnosis not present

## 2014-05-13 MED ORDER — OMALIZUMAB 150 MG ~~LOC~~ SOLR
150.0000 mg | Freq: Once | SUBCUTANEOUS | Status: AC
  Administered 2014-05-13: 150 mg via SUBCUTANEOUS

## 2014-06-03 ENCOUNTER — Other Ambulatory Visit: Payer: Self-pay | Admitting: Family Medicine

## 2014-06-06 ENCOUNTER — Ambulatory Visit (INDEPENDENT_AMBULATORY_CARE_PROVIDER_SITE_OTHER): Payer: Managed Care, Other (non HMO)

## 2014-06-06 DIAGNOSIS — J45909 Unspecified asthma, uncomplicated: Secondary | ICD-10-CM

## 2014-06-06 NOTE — Telephone Encounter (Signed)
Completely out has an appointment 7.15.15. Would you refill at least 1 month

## 2014-06-09 ENCOUNTER — Other Ambulatory Visit: Payer: Self-pay | Admitting: *Deleted

## 2014-06-09 DIAGNOSIS — J45909 Unspecified asthma, uncomplicated: Secondary | ICD-10-CM

## 2014-06-09 DIAGNOSIS — F411 Generalized anxiety disorder: Secondary | ICD-10-CM

## 2014-06-09 MED ORDER — ESCITALOPRAM OXALATE 10 MG PO TABS: ORAL_TABLET | ORAL | Status: DC

## 2014-06-09 MED ORDER — OMALIZUMAB 150 MG ~~LOC~~ SOLR
150.0000 mg | Freq: Once | SUBCUTANEOUS | Status: AC
  Administered 2014-06-09: 150 mg via SUBCUTANEOUS

## 2014-06-11 ENCOUNTER — Encounter: Payer: Self-pay | Admitting: Family Medicine

## 2014-06-11 ENCOUNTER — Ambulatory Visit (INDEPENDENT_AMBULATORY_CARE_PROVIDER_SITE_OTHER): Payer: Private Health Insurance - Indemnity | Admitting: Family Medicine

## 2014-06-11 VITALS — BP 107/72 | HR 81 | Resp 16 | Ht 68.0 in | Wt 163.0 lb

## 2014-06-11 DIAGNOSIS — I10 Essential (primary) hypertension: Secondary | ICD-10-CM

## 2014-06-11 DIAGNOSIS — E1165 Type 2 diabetes mellitus with hyperglycemia: Secondary | ICD-10-CM

## 2014-06-11 DIAGNOSIS — IMO0001 Reserved for inherently not codable concepts without codable children: Secondary | ICD-10-CM

## 2014-06-11 DIAGNOSIS — J45909 Unspecified asthma, uncomplicated: Secondary | ICD-10-CM

## 2014-06-11 DIAGNOSIS — B360 Pityriasis versicolor: Secondary | ICD-10-CM

## 2014-06-11 DIAGNOSIS — E785 Hyperlipidemia, unspecified: Secondary | ICD-10-CM

## 2014-06-11 DIAGNOSIS — F411 Generalized anxiety disorder: Secondary | ICD-10-CM

## 2014-06-11 LAB — POCT GLYCOSYLATED HEMOGLOBIN (HGB A1C): Hemoglobin A1C: 7.8

## 2014-06-11 MED ORDER — DULAGLUTIDE 1.5 MG/0.5ML ~~LOC~~ SOAJ: 1.0000 "pen " | SUBCUTANEOUS | Status: DC

## 2014-06-11 MED ORDER — BUDESONIDE-FORMOTEROL FUMARATE 160-4.5 MCG/ACT IN AERO: 2.0000 | INHALATION_SPRAY | Freq: Two times a day (BID) | RESPIRATORY_TRACT | Status: AC

## 2014-06-11 MED ORDER — ROSUVASTATIN CALCIUM 40 MG PO TABS: ORAL_TABLET | ORAL | Status: AC

## 2014-06-11 MED ORDER — ESCITALOPRAM OXALATE 10 MG PO TABS: ORAL_TABLET | ORAL | Status: AC

## 2014-06-11 MED ORDER — CANAGLIFLOZIN-METFORMIN HCL 150-1000 MG PO TABS: 1.0000 | ORAL_TABLET | Freq: Two times a day (BID) | ORAL | Status: DC

## 2014-06-11 MED ORDER — KETOCONAZOLE 2 % EX CREA: 1.0000 "application " | TOPICAL_CREAM | Freq: Two times a day (BID) | CUTANEOUS | Status: AC

## 2014-06-11 NOTE — Progress Notes (Signed)
Subjective:    Patient ID: John Chandler, male    DOB: 09/01/1956, 58 y.o.   MRN: [id number]  HPI  John Chandler is here today concerned about his blood sugars. He has noticed over the past two months that they have been creeping up.  They are staying in the 150s now.  He is taking his medications and watching his diet so he's not sure why his sugars have been running higher than usual. He also needs refills on several medications while he is here today.    Review of Systems  Constitutional: Negative for activity change, appetite change and fatigue.  Cardiovascular: Negative for chest pain, palpitations and leg swelling.  Endocrine: Negative for polydipsia, polyphagia and polyuria.  Psychiatric/Behavioral: Negative for behavioral problems. The patient is not nervous/anxious.   All other systems reviewed and are negative.    Past Medical History  Diagnosis Date  . Acid reflux   . Allergy   . Genital herpes   . Asthma   . Type II or unspecified type diabetes mellitus without mention of complication, uncontrolled   . Headache   . Low testosterone   . Pneumonia 09/2011  . Restless leg syndrome   . Hyperlipidemia   . Cough      Past Surgical History  Procedure Laterality Date  . Appendectomy    . Tonsillectomy       History   Social History Narrative   Marital Status: Married Clydie Braun)   Children:  None    Pets:  Cats (2); Dog (1)    Living Situation: Lives with spouse   Occupation: Civil engineer, contracting Transport planner)   Education:  Engineer, agricultural - Water quality scientist   Tobacco Use/Exposure:  None    Alcohol Use:  None   Drug Use:  None   Diet:  Regular   Exercise:  He has started walking more on his treadmill.    Hobbies:  Reading, Working on Projects      Family History  Problem Relation Age of Onset  . Diabetes Mother   . Heart disease Father   . Thyroid disease Sister   . Diabetes Maternal Grandmother   . COPD Mother      Current Outpatient  Prescriptions on File Prior to Visit  Medication Sig Dispense Refill  . albuterol (PROVENTIL HFA;VENTOLIN HFA) 108 (90 BASE) MCG/ACT inhaler Inhale 2 puffs into the lungs every 6 (six) hours as needed for wheezing.  1 Inhaler  6  . Canagliflozin (INVOKANA) 300 MG TABS Take 1 tablet (300 mg total) by mouth daily.  30 tablet  5  . EPINEPHrine (EPI-PEN) 0.3 mg/0.3 mL SOAJ injection Inject 0.3 mLs (0.3 mg total) into the muscle once.  1 Device  11  . glucose blood (FREESTYLE LITE) test strip Check sugar daily as directed.  100 each  3  . omalizumab (XOLAIR) 150 MG injection 150 mg [place] every 4 weeks  150 each  prn  . rOPINIRole (REQUIP) 2 MG tablet Take 2 mg by mouth at bedtime as needed.       . selenium sulfide (SELSUN) 2.5 % shampoo Apply to skin and let stand for 15 mins before rinsing.  120 mL  11  . sitaGLIPtin-metformin (JANUMET) 50-1000 MG per tablet Take 1 tablet by mouth 2 (two) times daily with a meal.  60 tablet  5  . topiramate (TOPAMAX) 100 MG tablet Take 100 mg by mouth daily.        No current facility-administered medications on  file prior to visit.     No Known Allergies   Immunization History  Administered Date(s) Administered  . Influenza,inj,Quad PF,36+ Mos 08/23/2013       Objective:   Physical Exam  Vitals reviewed. Constitutional: He is oriented to person, place, and time. He appears well-developed and well-nourished. No distress.  HENT:  Head: Normocephalic.  Eyes: No scleral icterus.  Neck: Neck supple. No thyromegaly present.  Cardiovascular: Normal rate, regular rhythm and normal heart sounds.   Pulmonary/Chest: Effort normal and breath sounds normal.  Musculoskeletal: Normal range of motion. He exhibits no edema.  Neurological: He is alert and oriented to person, place, and time.  Skin: Skin is warm and dry. No rash noted.  Psychiatric: He has a normal mood and affect. His behavior is normal. Judgment and thought content normal.      Assessment & Plan:      [name] was seen today for medical management of chronic issues.  Diagnoses and associated orders for this visit:  Essential hypertension, benign  Type II or unspecified type diabetes mellitus without mention of complication, uncontrolled - POCT HgB A1C (7.8)  - Dulaglutide (TRULICITY) 1.5 MG/0.5ML SOPN; Inject 1 pen into the skin once a week. - Canagliflozin-Metformin HCl [phone] MG TABS; Take 1 tablet by mouth 2 (two) times daily.  Other and unspecified hyperlipidemia - rosuvastatin (CRESTOR) 40 MG tablet; Take 1/2- 1 tab po daily  Unspecified asthma(493.90) - budesonide-formoterol (SYMBICORT) 160-4.5 MCG/ACT inhaler; Inhale 2 puffs into the lungs 2 (two) times daily.  Anxiety state, unspecified - escitalopram (LEXAPRO) 10 MG tablet; Take 1/2 - 1 tab po q day  Tinea versicolor - ketoconazole (NIZORAL) 2 % cream; Apply 1 application topically 2 (two) times daily.  TIME SPENT "FACE TO FACE" WITH PATIENT -  30 MINS

## 2014-07-01 NOTE — Assessment & Plan Note (Signed)
This would be early to be confident of a response to Xolair.  Plan-continue Xolair. Okay to try reducing Symbicort one puff twice daily as discussed

## 2014-07-01 NOTE — Assessment & Plan Note (Signed)
Watching for benefit from Xolair

## 2014-07-04 ENCOUNTER — Ambulatory Visit (INDEPENDENT_AMBULATORY_CARE_PROVIDER_SITE_OTHER): Payer: Private Health Insurance - Indemnity

## 2014-07-04 DIAGNOSIS — J45909 Unspecified asthma, uncomplicated: Secondary | ICD-10-CM

## 2014-07-08 MED ORDER — OMALIZUMAB 150 MG ~~LOC~~ SOLR
150.0000 mg | Freq: Once | SUBCUTANEOUS | Status: AC
  Administered 2014-07-08: 150 mg via SUBCUTANEOUS

## 2014-08-01 ENCOUNTER — Ambulatory Visit (INDEPENDENT_AMBULATORY_CARE_PROVIDER_SITE_OTHER): Payer: Private Health Insurance - Indemnity

## 2014-08-01 DIAGNOSIS — J45909 Unspecified asthma, uncomplicated: Secondary | ICD-10-CM

## 2014-08-06 MED ORDER — OMALIZUMAB 150 MG ~~LOC~~ SOLR
150.0000 mg | Freq: Once | SUBCUTANEOUS | Status: AC
  Administered 2014-08-06: 150 mg via SUBCUTANEOUS

## 2014-08-28 ENCOUNTER — Other Ambulatory Visit: Payer: Private Health Insurance - Indemnity

## 2014-08-28 ENCOUNTER — Encounter: Payer: Self-pay | Admitting: Internal Medicine

## 2014-08-28 ENCOUNTER — Ambulatory Visit (INDEPENDENT_AMBULATORY_CARE_PROVIDER_SITE_OTHER): Payer: Private Health Insurance - Indemnity | Admitting: Internal Medicine

## 2014-08-28 VITALS — BP 108/70 | HR 77 | Ht 68.0 in | Wt 162.2 lb

## 2014-08-28 DIAGNOSIS — J4530 Mild persistent asthma, uncomplicated: Secondary | ICD-10-CM

## 2014-08-28 DIAGNOSIS — J3089 Other allergic rhinitis: Secondary | ICD-10-CM

## 2014-08-28 DIAGNOSIS — J309 Allergic rhinitis, unspecified: Secondary | ICD-10-CM

## 2014-08-28 DIAGNOSIS — K219 Gastro-esophageal reflux disease without esophagitis: Secondary | ICD-10-CM

## 2014-08-28 DIAGNOSIS — J302 Other seasonal allergic rhinitis: Secondary | ICD-10-CM

## 2014-08-28 DIAGNOSIS — Z23 Encounter for immunization: Secondary | ICD-10-CM

## 2014-08-28 DIAGNOSIS — J4541 Moderate persistent asthma with (acute) exacerbation: Secondary | ICD-10-CM

## 2014-08-28 NOTE — Patient Instructions (Signed)
Flu vax  Order- schedule PFT     Dx asthma mild persistent uncomplicated  Order- lab- alpha 1 antitrypsin   We are stopping Xolair trial since it has not been effective after 6 month trial

## 2014-08-28 NOTE — Progress Notes (Signed)
02/25/14- 63 yoM never smoker referred courtesy of Dr Delford Field-? allergies causing cough. Labs were done. Perennial cough x many years. Controlled best with asthma inhalers- Albuterol and Symbicort. Remote singulair no help. Denies seasonal, but triggers include cold weather. Mild nasal stuffiness- no polyps. As a child was allergic to cats. Now has 2 cats- doesn't recognize a problem.  Environment- house, no mold/ dampness. No smokers. 2 cats. +encasings on bedding, +HEPA filter. Uses AllerPet on the cats and an anti-allergy spray on walls and furniture. Home office. No problems with foods, skin rashes, insect stings, aspirin. Mother smoker-COPD. Allergy Profile 11/12/13- Total IgE 99.0, elevations for cat, dog, dust mite, grass pollens, tree pollens, ragweed.   02/26/14- 30 yoM never smoker referred courtesy of Dr Delford Field-? allergies causing cough. Labs were done. No antihistamines, OTC cough syrups, or OTC sleep aids. Here today for allergy skin testing. No acute changes. Allergy skin test 02/26/14- Positive broadly for grass, weed and tree pollens, dust mite, cat and dog.  We discussed allergy vaccine compared with Xolair. He can work more easily with the schedule for Xolair, calculated at 150 mg every 4 weeks ( IgE 99 on 11/12/13, Weight 168 lbs).  04/28/14- 10 yoM never smoker referred courtesy of Dr Delford Field-? allergies causing cough.  No antihistamines, OTC cough syrups, or OTC sleep aids. FOLLOWS FOR:  Reports not coughing as much. Had first xolair shot x3 weeks ago and went well. Rare need for rescue inhaler. No recent acute events.  08/28/14-58 yoM never smoker referred courtesy of Dr Delford Field-? allergies causing cough.  FOLLOWS FOR: Unsure if Xolair is working; continues to have same cough as he did prior to Xolair treatment Cannot tell that Xolair has helped his cough any after 6 month trial. Still feels sense of congestion in his chest most days. Cough is always dry with little  wheeze.  ROS-see HPI Constitutional:   No-   weight loss, night sweats, fevers, chills, fatigue, lassitude. HEENT:   No-  headaches, difficulty swallowing, tooth/dental problems, sore throat,       No-  sneezing, itching, ear ache, nasal congestion, post nasal drip,  CV:  No-   chest pain, orthopnea, PND, swelling in lower extremities, anasarca,                                  dizziness, palpitations Resp: No-   shortness of breath with exertion or at rest.              No-   productive cough,  + non-productive cough,  No- coughing up of blood.              No-   change in color of mucus. + little wheezing.   Skin: No-   rash or lesions. GI:  No-   heartburn, indigestion, abdominal pain, nausea, vomiting,  GU:  MS:  No-   joint pain or swelling.   Neuro-     nothing unusual Psych:  No- change in mood or affect. No depression or anxiety.  No memory loss.  OBJ- Physical Exam  Medium build General- Alert, Oriented, Affect-appropriate, Distress- none acute Skin- rash-none, lesions- none, excoriation- none Lymphadenopathy- none Head- atraumatic            Eyes- Gross vision intact, PERRLA, conjunctivae and secretions clear            Ears- Hearing, canals-normal  Nose- Clear, no-Septal dev, mucus+watery clear, No-polyps, erosion, perforation             Throat- Mallampati II , mucosa+cobblestone , drainage- none, tonsils- atrophic Neck- flexible , trachea midline, no stridor , thyroid nl, carotid no bruit Chest - symmetrical excursion , unlabored           Heart/CV- RRR , no murmur , no gallop  , no rub, nl s1 s2                           - JVD- none , edema- none, stasis changes- none, varices- none           Lung- clear to P&A/unlabored, wheeze-none, cough+ light , dullness-none, rub- none           Chest wall-  Abd-  Br/ Gen/ Rectal- Not done, not indicated Extrem- cyanosis- none, clubbing, none, atrophy- none, strength- nl Neuro- grossly intact to observation

## 2014-08-29 ENCOUNTER — Ambulatory Visit: Payer: Private Health Insurance - Indemnity

## 2014-08-30 NOTE — Assessment & Plan Note (Signed)
Xolair was no help and is discontinued. He does not recognize postnasal drip or reflux although cobblestone throat suggests either might be present. Plan-schedule PFT, a1AT, flu vaccine, stopped Xolair

## 2014-08-30 NOTE — Assessment & Plan Note (Signed)
Reviewed suggestive symptoms and basics of reflux precaution but he denies having any sense of reflux or heartburn

## 2014-08-30 NOTE — Assessment & Plan Note (Signed)
Does not recognize postnasal drip or nasal congestion now

## 2014-09-03 LAB — ALPHA-1 ANTITRYPSIN PHENOTYPE: A-1 Antitrypsin: 122 mg/dL (ref 83–199)

## 2014-10-30 ENCOUNTER — Ambulatory Visit (INDEPENDENT_AMBULATORY_CARE_PROVIDER_SITE_OTHER): Payer: Private Health Insurance - Indemnity | Admitting: Internal Medicine

## 2014-10-30 ENCOUNTER — Encounter: Payer: Self-pay | Admitting: Internal Medicine

## 2014-10-30 VITALS — BP 92/68 | HR 81 | Ht 68.0 in | Wt 164.0 lb

## 2014-10-30 DIAGNOSIS — J4530 Mild persistent asthma, uncomplicated: Secondary | ICD-10-CM

## 2014-10-30 DIAGNOSIS — J302 Other seasonal allergic rhinitis: Secondary | ICD-10-CM

## 2014-10-30 DIAGNOSIS — J3089 Other allergic rhinitis: Principal | ICD-10-CM

## 2014-10-30 DIAGNOSIS — J4541 Moderate persistent asthma with (acute) exacerbation: Secondary | ICD-10-CM

## 2014-10-30 DIAGNOSIS — J309 Allergic rhinitis, unspecified: Secondary | ICD-10-CM

## 2014-10-30 LAB — PULMONARY FUNCTION TEST
DL/VA % PRED: 83 %
DL/VA: 3.77 ml/min/mmHg/L
DLCO unc % pred: 103 %
DLCO unc: 30.69 ml/min/mmHg
FEF 25-75 POST: 4.25 L/s
FEF 25-75 Pre: 2.54 L/sec
FEF2575-%Change-Post: 67 %
FEF2575-%Pred-Post: 148 %
FEF2575-%Pred-Pre: 88 %
FEV1-%Change-Post: 13 %
FEV1-%Pred-Post: 111 %
FEV1-%Pred-Pre: 97 %
FEV1-POST: 3.79 L
FEV1-PRE: 3.33 L
FEV1FVC-%CHANGE-POST: 11 %
FEV1FVC-%PRED-PRE: 97 %
FEV6-%Change-Post: 3 %
FEV6-%Pred-Post: 107 %
FEV6-%Pred-Pre: 104 %
FEV6-Post: 4.59 L
FEV6-Pre: 4.46 L
FEV6FVC-%Change-Post: 0 %
FEV6FVC-%Pred-Post: 104 %
FEV6FVC-%Pred-Pre: 103 %
FVC-%CHANGE-POST: 2 %
FVC-%Pred-Post: 102 %
FVC-%Pred-Pre: 100 %
FVC-PRE: 4.5 L
FVC-Post: 4.59 L
PRE FEV1/FVC RATIO: 74 %
Post FEV1/FVC ratio: 82 %
Post FEV6/FVC ratio: 100 %
Pre FEV6/FVC Ratio: 99 %
RV % PRED: 91 %
RV: 1.93 L
TLC % pred: 97 %
TLC: 6.4 L

## 2014-10-30 NOTE — Progress Notes (Signed)
02/25/14- [age] yoM never smoker referred courtesy of Dr Delford FieldWright-? allergies causing cough. Labs were done. Perennial cough x many years. Controlled best with asthma inhalers- Albuterol and Symbicort. Remote singulair no help. Denies seasonal, but triggers include cold weather. Mild nasal stuffiness- no polyps. As a child was allergic to cats. Now has 2 cats- doesn't recognize a problem.  Environment- house, no mold/ dampness. No smokers. 2 cats. +encasings on bedding, +HEPA filter. Uses AllerPet on the cats and an anti-allergy spray on walls and furniture. Home office. No problems with foods, skin rashes, insect stings, aspirin. Mother smoker-COPD. Allergy Profile 11/12/13- Total IgE 99.0, elevations for cat, dog, dust mite, grass pollens, tree pollens, ragweed.   02/26/14- [age] yoM never smoker referred courtesy of Dr Delford FieldWright-? allergies causing cough. Labs were done. No antihistamines, OTC cough syrups, or OTC sleep aids. Here today for allergy skin testing. No acute changes. Allergy skin test 02/26/14- Positive broadly for grass, weed and tree pollens, dust mite, cat and dog.  We discussed allergy vaccine compared with Xolair. He can work more easily with the schedule for Xolair, calculated at 150 mg every 4 weeks ( IgE 99 on 11/12/13, Weight 168 lbs).  04/28/14- [age] yoM never smoker referred courtesy of Dr Delford FieldWright-? allergies causing cough.  No antihistamines, OTC cough syrups, or OTC sleep aids. FOLLOWS FOR:  Reports not coughing as much. Had first xolair shot x3 weeks ago and went well. Rare need for rescue inhaler. No recent acute events.  08/28/14-58 yoM never smoker referred courtesy of Dr Delford FieldWright-? allergies causing cough.  FOLLOWS FOR: Unsure if Xolair is working; continues to have same cough as he did prior to Xolair treatment Cannot tell that Xolair has helped his cough any after 6 month trial. Still feels sense of congestion in his chest most days. Cough is always dry with little  wheeze.  12//3/15- [age] yoM never smoker referred courtesy of Dr Delford FieldWright-? allergies causing cough FOLLOWS FOR: can not tell any difference being off Xolair vs on it; Review PFT in detail with patient. PFT 10/30/14- WNL FEV1/FVC 0.82 Office nose is stuffy but he is breathing better with less cough. Says nasal sprays are not much help. He has been no worse since stopping Xolair which did not help any. He continues on Symbicort. a1AT WNL  ROS-see HPI Constitutional:   No-   weight loss, night sweats, fevers, chills, fatigue, lassitude. HEENT:   No-  headaches, difficulty swallowing, tooth/dental problems, sore throat,       No-  sneezing, itching, ear ache, + nasal congestion, post nasal drip,  CV:  No-   chest pain, orthopnea, PND, swelling in lower extremities, anasarca,                                  dizziness, palpitations Resp: No-   shortness of breath with exertion or at rest.              No-   productive cough,  + non-productive cough,  No- coughing up of blood.              No-   change in color of mucus. + little wheezing.   Skin: No-   rash or lesions. GI:  No-   heartburn, indigestion, abdominal pain, nausea, vomiting,  GU:  MS:  No-   joint pain or swelling.   Neuro-     nothing unusual Psych:  No-  change in mood or affect. No depression or anxiety.  No memory loss.  OBJ- Physical Exam  Medium build General- Alert, Oriented, Affect-appropriate, Distress- none acute Skin- rash-none, lesions- none, excoriation- none Lymphadenopathy- none Head- atraumatic            Eyes- Gross vision intact, PERRLA, conjunctivae and secretions clear            Ears- Hearing, canals-normal            Nose- + turbinate edema, no-Septal dev, mucus+watery clear, No-polyps,                      or erosion, perforation             Throat- Mallampati II , mucosa+cobblestone , drainage- none, tonsils-                              atrophic Neck- flexible , trachea midline, no stridor , thyroid nl,  carotid no bruit Chest - symmetrical excursion , unlabored           Heart/CV- RRR , no murmur , no gallop  , no rub, nl s1 s2                           - JVD- none , edema- none, stasis changes- none, varices- none           Lung- clear to P&A/unlabored, wheeze-none, cough+ light , dullness-none, rub- none           Chest wall-  Abd-  Br/ Gen/ Rectal- Not done, not indicated Extrem- cyanosis- none, clubbing, none, atrophy- none, strength- nl Neuro- grossly intact to observation

## 2014-10-30 NOTE — Patient Instructions (Addendum)
Sample Dymista nasal spray  1-2 puffs each nostril once daily at bedtime  Ok to continue Symbicort and to call for refill if needed  Please call if we can help

## 2014-10-30 NOTE — Progress Notes (Signed)
PFT done today. 

## 2014-11-02 NOTE — Assessment & Plan Note (Signed)
Perennial nasal congestion consistent with allergic rhinitis Plan-sample Dymista nasal spray for trial

## 2014-11-02 NOTE — Assessment & Plan Note (Signed)
Much better control. He continues Symbicort. Normal PFT when at baseline.

## 2015-03-24 ENCOUNTER — Encounter: Payer: Self-pay | Admitting: Internal Medicine

## 2015-04-03 ENCOUNTER — Encounter: Payer: Self-pay | Admitting: Internal Medicine

## 2015-04-10 ENCOUNTER — Encounter: Payer: Self-pay | Admitting: Internal Medicine

## 2015-04-10 MED ORDER — ALBUTEROL SULFATE HFA 108 (90 BASE) MCG/ACT IN AERS: 1.0000 | INHALATION_SPRAY | Freq: Four times a day (QID) | RESPIRATORY_TRACT | Status: DC | PRN

## 2015-05-01 ENCOUNTER — Other Ambulatory Visit (INDEPENDENT_AMBULATORY_CARE_PROVIDER_SITE_OTHER): Payer: Managed Care, Other (non HMO)

## 2015-05-01 ENCOUNTER — Encounter: Payer: Self-pay | Admitting: Internal Medicine

## 2015-05-01 ENCOUNTER — Ambulatory Visit (INDEPENDENT_AMBULATORY_CARE_PROVIDER_SITE_OTHER): Payer: Managed Care, Other (non HMO) | Admitting: Internal Medicine

## 2015-05-01 VITALS — BP 98/56 | HR 94 | Ht 68.0 in | Wt 164.0 lb

## 2015-05-01 DIAGNOSIS — J302 Other seasonal allergic rhinitis: Secondary | ICD-10-CM

## 2015-05-01 DIAGNOSIS — K219 Gastro-esophageal reflux disease without esophagitis: Secondary | ICD-10-CM

## 2015-05-01 DIAGNOSIS — J3089 Other allergic rhinitis: Principal | ICD-10-CM

## 2015-05-01 DIAGNOSIS — J4541 Moderate persistent asthma with (acute) exacerbation: Secondary | ICD-10-CM | POA: Diagnosis not present

## 2015-05-01 DIAGNOSIS — J309 Allergic rhinitis, unspecified: Secondary | ICD-10-CM | POA: Diagnosis not present

## 2015-05-01 LAB — CBC WITH DIFFERENTIAL/PLATELET
Basophils Absolute: 0.1 10*3/uL (ref 0.0–0.1)
Basophils Relative: 1.2 % (ref 0.0–3.0)
Eosinophils Absolute: 0.8 10*3/uL — ABNORMAL HIGH (ref 0.0–0.7)
Eosinophils Relative: 10.7 % — ABNORMAL HIGH (ref 0.0–5.0)
HCT: 39.3 % (ref 39.0–52.0)
Hemoglobin: 13.3 g/dL (ref 13.0–17.0)
LYMPHS ABS: 1 10*3/uL (ref 0.7–4.0)
Lymphocytes Relative: 13.7 % (ref 12.0–46.0)
MCHC: 33.8 g/dL (ref 30.0–36.0)
MCV: 82.4 fl (ref 78.0–100.0)
MONOS PCT: 10.9 % (ref 3.0–12.0)
Monocytes Absolute: 0.8 10*3/uL (ref 0.1–1.0)
NEUTROS ABS: 4.8 10*3/uL (ref 1.4–7.7)
Neutrophils Relative %: 63.5 % (ref 43.0–77.0)
Platelets: 207 10*3/uL (ref 150.0–400.0)
RBC: 4.77 Mil/uL (ref 4.22–5.81)
RDW: 13.5 % (ref 11.5–15.5)
WBC: 7.6 10*3/uL (ref 4.0–10.5)

## 2015-05-01 MED ORDER — GABAPENTIN 100 MG PO CAPS: 100.0000 mg | ORAL_CAPSULE | Freq: Three times a day (TID) | ORAL | Status: DC

## 2015-05-01 MED ORDER — TIOTROPIUM BROMIDE-OLODATEROL 2.5-2.5 MCG/ACT IN AERS: 2.0000 | INHALATION_SPRAY | Freq: Every day | RESPIRATORY_TRACT | Status: DC

## 2015-05-01 NOTE — Progress Notes (Signed)
02/25/14- [age] yoM never smoker referred courtesy of Dr Delford FieldWright-? allergies causing cough. Labs were done. Perennial cough x many years. Controlled best with asthma inhalers- Albuterol and Symbicort. Remote singulair no help. Denies seasonal, but triggers include cold weather. Mild nasal stuffiness- no polyps. As a child was allergic to cats. Now has 2 cats- doesn't recognize a problem.  Environment- house, no mold/ dampness. No smokers. 2 cats. +encasings on bedding, +HEPA filter. Uses AllerPet on the cats and an anti-allergy spray on walls and furniture. Home office. No problems with foods, skin rashes, insect stings, aspirin. Mother smoker-COPD. Allergy Profile 11/12/13- Total IgE 99.0, elevations for cat, dog, dust mite, grass pollens, tree pollens, ragweed.   02/26/14- [age] yoM never smoker referred courtesy of Dr Delford FieldWright-? allergies causing cough. Labs were done. No antihistamines, OTC cough syrups, or OTC sleep aids. Here today for allergy skin testing. No acute changes. Allergy skin test 02/26/14- Positive broadly for grass, weed and tree pollens, dust mite, cat and dog.  We discussed allergy vaccine compared with Xolair. He can work more easily with the schedule for Xolair, calculated at 150 mg every 4 weeks ( IgE 99 on 11/12/13, Weight 168 lbs).  04/28/14- [age] yoM never smoker referred courtesy of Dr Delford FieldWright-? allergies causing cough.  No antihistamines, OTC cough syrups, or OTC sleep aids. FOLLOWS FOR:  Reports not coughing as much. Had first xolair shot x3 weeks ago and went well. Rare need for rescue inhaler. No recent acute events.  08/28/14-58 yoM never smoker referred courtesy of Dr Delford FieldWright-? allergies causing cough.  FOLLOWS FOR: Unsure if Xolair is working; continues to have same cough as he did prior to Xolair treatment Cannot tell that Xolair has helped his cough any after 6 month trial. Still feels sense of congestion in his chest most days. Cough is always dry with little  wheeze.  12//3/15- [age] yoM never smoker referred courtesy of Dr Delford FieldWright-? allergies causing cough FOLLOWS FOR: can not tell any difference being off Xolair vs on it; Review PFT in detail with patient. PFT 10/30/14- WNL FEV1/FVC 0.82 Office nose is stuffy but he is breathing better with less cough. Says nasal sprays are not much help. He has been no worse since stopping Xolair which did not help any. He continues on Symbicort. a1AT WNL  05/01/15- 58 yoM never smoker referred courtesy of Dr Delford FieldWright-? allergies causing cough.  Failed Xolair Reports: breathing ok; cough is the issue; dry cough comes and goes;    ROS-see HPI Constitutional:   No-   weight loss, night sweats, fevers, chills, fatigue, lassitude. HEENT:   No-  headaches, difficulty swallowing, tooth/dental problems, sore throat,       No-  sneezing, itching, ear ache, + nasal congestion, post nasal drip,  CV:  No-   chest pain, orthopnea, PND, swelling in lower extremities, anasarca,                                  dizziness, palpitations Resp: No-   shortness of breath with exertion or at rest.              No-   productive cough,  + non-productive cough,  No- coughing up of blood.              No-   change in color of mucus. + little wheezing.   Skin: No-   rash or lesions. GI:  No-  heartburn, indigestion, abdominal pain, nausea, vomiting,  GU:  MS:  No-   joint pain or swelling.   Neuro-     nothing unusual Psych:  No- change in mood or affect. No depression or anxiety.  No memory loss.  OBJ- Physical Exam  Medium build General- Alert, Oriented, Affect-appropriate, Distress- none acute Skin- rash-none, lesions- none, excoriation- none Lymphadenopathy- none Head- atraumatic            Eyes- Gross vision intact, PERRLA, conjunctivae and secretions clear            Ears- Hearing, canals-normal            Nose- + turbinate edema, no-Septal dev, mucus+watery clear, No-polyps,                      or erosion, perforation              Throat- Mallampati II , mucosa+cobblestone , drainage- none, tonsils-                              atrophic Neck- flexible , trachea midline, no stridor , thyroid nl, carotid no bruit Chest - symmetrical excursion , unlabored           Heart/CV- RRR , no murmur , no gallop  , no rub, nl s1 s2                           - JVD- none , edema- none, stasis changes- none, varices- none           Lung- clear to P&A/unlabored, wheeze-none, cough+ light , dullness-none, rub- none           Chest wall-  Abd-  Br/ Gen/ Rectal- Not done, not indicated Extrem- cyanosis- none, clubbing, none, atrophy- none, strength- nl Neuro- grossly intact to observation

## 2015-05-01 NOTE — Patient Instructions (Signed)
Sample Stiolto Respimat inhaler  2 puffs, twice daily  Script printed for Neurontin/ gabapentin for cough. Try 1 every 8 hours.  Try this after you have tried the Stiolto  Order CBC w diff  We may want to try Nucala for cough if other measures don't help enough.

## 2015-05-02 NOTE — Assessment & Plan Note (Signed)
This appears to be consistent with a cough equivalent asthma. He might be a candidate for Nucala, Imdur eosinophils, noting that he failed Xolair. Plan-try Stiolto Respimat inhaler, then try gabapentin aiming at an irritable upper airway nerve mechanism. If these don't help and if eosinophils are elevated, then consider Nucala Labs for CBC with differential and total IgE

## 2015-05-02 NOTE — Assessment & Plan Note (Signed)
He is denying any symptoms of reflux today. We emphasized how that might be related to his chronic cough complaint.

## 2015-05-05 ENCOUNTER — Telehealth: Payer: Self-pay

## 2015-05-05 NOTE — Telephone Encounter (Signed)
PFTs have shown asthma pattern obstructive airways disease with reversibility, but symptoms have not responded adequately to appropriate trials of long and short-acting bronchodilators, inhaled and oral steroids. Allergy testing has confirmed an allergic pattern but he did not respond to anti-IgE therapy with Xolair. Eosinophils remain elevated. Nucala anti-IL5 therapy has been explained, including realistic risk and benefit explanation. He wishes to try. Order- Begin Nucala application for dx persistent asthma, hyper-eosinophilia

## 2015-05-05 NOTE — Telephone Encounter (Signed)
Pt returning call.John Chandler ° °

## 2015-05-05 NOTE — Telephone Encounter (Signed)
LMTCB-need to let patient know of results and find out when he can come by the office to sign papers to start Nucala process.

## 2015-05-05 NOTE — Telephone Encounter (Signed)
LMTCB

## 2015-05-05 NOTE — Telephone Encounter (Signed)
Notes Recorded by Waymon Budgelinton D Young, MD on 05/01/2015 at 3:38 PM CBC blood test today shows elevated eosinophils. That means that he could be a candidate to try the Nucala therapy we mentioned this morning, if simpler treatment for his cough doesn't help ------------------------------------------------------  I spoke with the pt and he is interested in starting Nucala therapy.   Dr. Delena BaliYoung/[name] please advise on how to proceed with this.  Thanks!

## 2015-05-07 NOTE — Telephone Encounter (Signed)
[name] spoke with patient-he is aware of results and recs from CY. He is willing to try Nucala and will come by the office on Friday 05-08-15 at 2:30pm to speak with [facility] and fill out forms. Will sign off on message.

## 2015-05-25 ENCOUNTER — Other Ambulatory Visit: Payer: Self-pay

## 2015-07-29 ENCOUNTER — Telehealth: Payer: Self-pay | Admitting: Internal Medicine

## 2015-07-29 MED ORDER — EPINEPHRINE 0.3 MG/0.3ML IJ SOAJ: 0.3000 mg | Freq: Once | INTRAMUSCULAR | Status: DC

## 2015-07-29 NOTE — Telephone Encounter (Signed)
epipen sent.

## 2015-08-04 ENCOUNTER — Ambulatory Visit: Payer: Managed Care, Other (non HMO) | Admitting: Internal Medicine

## 2015-08-11 ENCOUNTER — Encounter: Payer: Self-pay | Admitting: Internal Medicine

## 2015-08-11 ENCOUNTER — Ambulatory Visit (INDEPENDENT_AMBULATORY_CARE_PROVIDER_SITE_OTHER): Payer: Managed Care, Other (non HMO) | Admitting: Internal Medicine

## 2015-08-11 VITALS — BP 116/70 | HR 77 | Ht 68.0 in | Wt 180.2 lb

## 2015-08-11 DIAGNOSIS — J4541 Moderate persistent asthma with (acute) exacerbation: Secondary | ICD-10-CM | POA: Diagnosis not present

## 2015-08-11 NOTE — Patient Instructions (Signed)
Ok to start Nucala  Please call as needed

## 2015-08-11 NOTE — Progress Notes (Signed)
02/25/14- 42 yoM never smoker referred courtesy of Dr Delford Field-? allergies causing cough. Labs were done. Perennial cough x many years. Controlled best with asthma inhalers- Albuterol and Symbicort. Remote singulair no help. Denies seasonal, but triggers include cold weather. Mild nasal stuffiness- no polyps. As a child was allergic to cats. Now has 2 cats- doesn't recognize a problem.  Environment- house, no mold/ dampness. No smokers. 2 cats. +encasings on bedding, +HEPA filter. Uses AllerPet on the cats and an anti-allergy spray on walls and furniture. Home office. No problems with foods, skin rashes, insect stings, aspirin. Mother smoker-COPD. Allergy Profile 11/12/13- Total IgE 99.0, elevations for cat, dog, dust mite, grass pollens, tree pollens, ragweed.   02/26/14- 17 yoM never smoker referred courtesy of Dr Delford Field-? allergies causing cough. Labs were done. No antihistamines, OTC cough syrups, or OTC sleep aids. Here today for allergy skin testing. No acute changes. Allergy skin test 02/26/14- Positive broadly for grass, weed and tree pollens, dust mite, cat and dog.  We discussed allergy vaccine compared with Xolair. He can work more easily with the schedule for Xolair, calculated at 150 mg every 4 weeks ( IgE 99 on 11/12/13, Weight 168 lbs).  04/28/14- 80 yoM never smoker referred courtesy of Dr Delford Field-? allergies causing cough.  No antihistamines, OTC cough syrups, or OTC sleep aids. FOLLOWS FOR:  Reports not coughing as much. Had first xolair shot x3 weeks ago and went well. Rare need for rescue inhaler. No recent acute events.  08/28/14-58 yoM never smoker referred courtesy of Dr Delford Field-? allergies causing cough.  FOLLOWS FOR: Unsure if Xolair is working; continues to have same cough as he did prior to Xolair treatment Cannot tell that Xolair has helped his cough any after 6 month trial. Still feels sense of congestion in his chest most days. Cough is always dry with little  wheeze.  12//3/15- 77 yoM never smoker referred courtesy of Dr Delford Field-? allergies causing cough FOLLOWS FOR: can not tell any difference being off Xolair vs on it; Review PFT in detail with patient. PFT 10/30/14- WNL FEV1/FVC 0.82 Office nose is stuffy but he is breathing better with less cough. Says nasal sprays are not much help. He has been no worse since stopping Xolair which did not help any. He continues on Symbicort. a1AT WNL  05/01/15- 58 yoM never smoker referred courtesy of Dr Delford Field-? allergies causing cough.  Failed Xolair Reports: breathing ok; cough is the issue; dry cough comes and goes;   FOLLOWS FOR: Increased cough since last visit; will start Nucala inj Friday 08-11-15. Stiolto Respimat was no help for his chronic cough. We discussed possible effect of time of year/season. He is not noticing much sneezing, postnasal drip or reflux.  ROS-see HPI Constitutional:   No-   weight loss, night sweats, fevers, chills, fatigue, lassitude. HEENT:   No-  headaches, difficulty swallowing, tooth/dental problems, sore throat,       No-  sneezing, itching, ear ache, + nasal congestion, post nasal drip,  CV:  No-   chest pain, orthopnea, PND, swelling in lower extremities, anasarca,                                                            dizziness, palpitations Resp: No-   shortness of breath with exertion or at  rest.              No-   productive cough,  + non-productive cough,  No- coughing up of blood.              No-   change in color of mucus. + little wheezing.   Skin: No-   rash or lesions. GI:  No-   heartburn, indigestion, abdominal pain, nausea, vomiting,  GU:  MS:  No-   joint pain or swelling.   Neuro-     nothing unusual Psych:  No- change in mood or affect. No depression or anxiety.  No memory loss.  OBJ- Physical Exam  Medium build General- Alert, Oriented, Affect-appropriate, Distress- none acute Skin- rash-none, lesions- none, excoriation- none Lymphadenopathy-  none Head- atraumatic            Eyes- Gross vision intact, PERRLA, conjunctivae and secretions clear            Ears- Hearing, canals-normal            Nose- + turbinate edema, no-Septal dev, mucus+watery clear, No-polyps,   or erosion, perforation             Throat- Mallampati II , mucosa+cobblestone , drainage- none, tonsils- atrophic Neck- flexible , trachea midline, no stridor , thyroid nl, carotid no bruit Chest - symmetrical excursion , unlabored           Heart/CV- RRR , no murmur , no gallop  , no rub, nl s1 s2                           - JVD- none , edema- none, stasis changes- none, varices- none           Lung- clear to P&A/unlabored, wheeze-none, cough+ coarse substernal, dullness-none, rub- none           Chest wall-  Abd-  Br/ Gen/ Rectal- Not done, not indicated Extrem- cyanosis- none, clubbing, none, atrophy- none, strength- nl Neuro- grossly intact to observation

## 2015-08-11 NOTE — Assessment & Plan Note (Signed)
Cough is somewhat worse but same chronic pattern. We are starting Nucala injections in hopes this could be managed as a cough equivalent asthma

## 2015-08-14 ENCOUNTER — Ambulatory Visit (INDEPENDENT_AMBULATORY_CARE_PROVIDER_SITE_OTHER): Payer: Managed Care, Other (non HMO)

## 2015-08-14 DIAGNOSIS — J4541 Moderate persistent asthma with (acute) exacerbation: Secondary | ICD-10-CM

## 2015-08-14 MED ORDER — MEPOLIZUMAB 100 MG ~~LOC~~ SOLR
1.0000 mL | Freq: Once | SUBCUTANEOUS | Status: AC
  Administered 2015-08-14: 1 mL via SUBCUTANEOUS

## 2015-09-04 ENCOUNTER — Telehealth: Payer: Self-pay | Admitting: Internal Medicine
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
## 2015-09-07 NOTE — Telephone Encounter (Signed)
John Chandler's Nucala is sch. To come in 09/09/15. The pharm. is going to keep reaching out to the pt., he has a co-pay. If he pays it it will come in on time. I will call the to either let him know he needs to call [employer] or if everthings been taken care of.I'm going to send this to Jae Dire so she'll know it's been done.

## 2015-09-08 NOTE — Telephone Encounter (Signed)
John Chandler, please update if medication arrival date has been confirmed? Thanks.

## 2015-09-09 NOTE — Telephone Encounter (Signed)
My bad. I did call the pharm. Yesterday to make sure Mr. John Chandler had called them back. He had so the med.s Should come in today. Nothing further needed.

## 2015-09-11 ENCOUNTER — Ambulatory Visit (INDEPENDENT_AMBULATORY_CARE_PROVIDER_SITE_OTHER): Payer: Managed Care, Other (non HMO)

## 2015-09-11 DIAGNOSIS — J4540 Moderate persistent asthma, uncomplicated: Secondary | ICD-10-CM | POA: Diagnosis not present

## 2015-09-15 MED ORDER — MEPOLIZUMAB 100 MG ~~LOC~~ SOLR
1.0000 mL | SUBCUTANEOUS | Status: DC
  Administered 2015-09-11: 1 mL via SUBCUTANEOUS

## 2015-10-06 ENCOUNTER — Telehealth: Payer: Self-pay | Admitting: Internal Medicine

## 2015-10-06 NOTE — Telephone Encounter (Signed)
Ordered Nucala 10/06/15 Del.10/07/15

## 2015-10-09 ENCOUNTER — Ambulatory Visit (INDEPENDENT_AMBULATORY_CARE_PROVIDER_SITE_OTHER): Payer: Managed Care, Other (non HMO)

## 2015-10-09 DIAGNOSIS — J45901 Unspecified asthma with (acute) exacerbation: Secondary | ICD-10-CM

## 2015-10-12 DIAGNOSIS — J45901 Unspecified asthma with (acute) exacerbation: Secondary | ICD-10-CM

## 2015-10-12 MED ORDER — MEPOLIZUMAB 100 MG ~~LOC~~ SOLR
1.0000 mL | Freq: Once | SUBCUTANEOUS | Status: AC
  Administered 2015-10-09: 1 mL via SUBCUTANEOUS

## 2015-10-12 NOTE — Telephone Encounter (Signed)
Nucala 10/09/15 Lot# [id number]  Exp. 2/18

## 2015-10-28 ENCOUNTER — Telehealth: Payer: Self-pay | Admitting: Internal Medicine

## 2015-10-28 NOTE — Telephone Encounter (Signed)
Spec. Pharm. Called Marisue IvanLiz and she confirmed delivery or ordered Nucala. Not sure why they called her #. Order Date: 10/28/15 Ship Date: 10/29/15

## 2015-11-02 NOTE — Telephone Encounter (Signed)
I didn't check Nucala in so I thought it had been put into epic it had arrived. Then I may have whoever put it in the fridge I would take care since I don't have a smart text yet.  That maybe what happened at any rate: Vials: 1 Arrival Date: 10/30/15  Lot#[id number] Exp. Date: 12/2016

## 2015-11-06 ENCOUNTER — Ambulatory Visit: Payer: Managed Care, Other (non HMO)

## 2015-11-10 ENCOUNTER — Ambulatory Visit (INDEPENDENT_AMBULATORY_CARE_PROVIDER_SITE_OTHER): Payer: Managed Care, Other (non HMO)

## 2015-11-10 DIAGNOSIS — J45901 Unspecified asthma with (acute) exacerbation: Secondary | ICD-10-CM

## 2015-11-10 MED ORDER — MEPOLIZUMAB 100 MG ~~LOC~~ SOLR
100.0000 mg | SUBCUTANEOUS | Status: DC
  Administered 2015-11-10: 100 mg via SUBCUTANEOUS

## 2015-11-25 ENCOUNTER — Telehealth: Payer: Self-pay | Admitting: Internal Medicine

## 2015-11-25 NOTE — Telephone Encounter (Signed)
Nucala Vials: 1  Arrival Date: 11/25/15 Pt. Has been ordering online too early. Will call pt. 11/26/15 Lot# [id number] Exp. Date: 2/18

## 2015-12-01 NOTE — Telephone Encounter (Signed)
I was able to get in touch with pt., Pharm. Was leaving messages for him to reorder. I explained to him I have to order nucala for 4 others, I'll keep up with it and I would start ordering his. Pt. Agreed. Nothing further needed.

## 2015-12-08 ENCOUNTER — Ambulatory Visit (INDEPENDENT_AMBULATORY_CARE_PROVIDER_SITE_OTHER): Payer: Managed Care, Other (non HMO)

## 2015-12-08 DIAGNOSIS — J4541 Moderate persistent asthma with (acute) exacerbation: Secondary | ICD-10-CM | POA: Diagnosis not present

## 2015-12-09 MED ORDER — MEPOLIZUMAB 100 MG ~~LOC~~ SOLR
1.0000 mL | Freq: Once | SUBCUTANEOUS | Status: AC
  Administered 2015-12-08: 1 mL via SUBCUTANEOUS

## 2015-12-15 ENCOUNTER — Ambulatory Visit (INDEPENDENT_AMBULATORY_CARE_PROVIDER_SITE_OTHER): Payer: Managed Care, Other (non HMO) | Admitting: Internal Medicine

## 2015-12-15 ENCOUNTER — Encounter: Payer: Self-pay | Admitting: Internal Medicine

## 2015-12-15 VITALS — BP 112/70 | HR 76 | Ht 68.0 in | Wt 195.6 lb

## 2015-12-15 DIAGNOSIS — J4541 Moderate persistent asthma with (acute) exacerbation: Secondary | ICD-10-CM

## 2015-12-15 MED ORDER — FLUTICASONE FUROATE-VILANTEROL 200-25 MCG/INH IN AEPB: 1.0000 | INHALATION_SPRAY | Freq: Every day | RESPIRATORY_TRACT | Status: DC

## 2015-12-15 MED ORDER — ALBUTEROL SULFATE HFA 108 (90 BASE) MCG/ACT IN AERS: 1.0000 | INHALATION_SPRAY | Freq: Four times a day (QID) | RESPIRATORY_TRACT | Status: DC | PRN

## 2015-12-15 NOTE — Progress Notes (Signed)
02/25/14- 35 yoM never smoker referred courtesy of Dr Delford Field-? allergies causing cough. Labs were done. Perennial cough x many years. Controlled best with asthma inhalers- Albuterol and Symbicort. Remote singulair no help. Denies seasonal, but triggers include cold weather. Mild nasal stuffiness- no polyps. As a child was allergic to cats. Now has 2 cats- doesn't recognize a problem.  Environment- house, no mold/ dampness. No smokers. 2 cats. +encasings on bedding, +HEPA filter. Uses AllerPet on the cats and an anti-allergy spray on walls and furniture. Home office. No problems with foods, skin rashes, insect stings, aspirin. Mother smoker-COPD. Allergy Profile 11/12/13- Total IgE 99.0, elevations for cat, dog, dust mite, grass pollens, tree pollens, ragweed.   02/26/14- 40 yoM never smoker referred courtesy of Dr Delford Field-? allergies causing cough. Labs were done. No antihistamines, OTC cough syrups, or OTC sleep aids. Here today for allergy skin testing. No acute changes. Allergy skin test 02/26/14- Positive broadly for grass, weed and tree pollens, dust mite, cat and dog.  We discussed allergy vaccine compared with Xolair. He can work more easily with the schedule for Xolair, calculated at 150 mg every 4 weeks ( IgE 99 on 11/12/13, Weight 168 lbs).  04/28/14- 77 yoM never smoker referred courtesy of Dr Delford Field-? allergies causing cough.  No antihistamines, OTC cough syrups, or OTC sleep aids. FOLLOWS FOR:  Reports not coughing as much. Had first xolair shot x3 weeks ago and went well. Rare need for rescue inhaler. No recent acute events.  08/28/14-58 yoM never smoker referred courtesy of Dr Delford Field-? allergies causing cough.  FOLLOWS FOR: Unsure if Xolair is working; continues to have same cough as he did prior to Xolair treatment Cannot tell that Xolair has helped his cough any after 6 month trial. Still feels sense of congestion in his chest most days. Cough is always dry with little  wheeze.  12//3/15- 55 yoM never smoker referred courtesy of Dr Delford Field-? allergies causing cough FOLLOWS FOR: can not tell any difference being off Xolair vs on it; Review PFT in detail with patient. PFT 10/30/14- WNL FEV1/FVC 0.82 Office nose is stuffy but he is breathing better with less cough. Says nasal sprays are not much help. He has been no worse since stopping Xolair which did not help any. He continues on Symbicort. a1AT WNL  05/01/15- 58 yoM never smoker referred courtesy of Dr Delford Field-? allergies causing cough.  Failed Xolair Reports: breathing ok; cough is the issue; dry cough comes and goes;   FOLLOWS FOR: Increased cough since last visit; will start Nucala inj Friday 08-11-15. Stiolto Respimat was no help for his chronic cough. We discussed possible effect of time of year/season. He is not noticing much sneezing, postnasal drip or reflux.  12/15/2015-60 year old male never smoker followed for chronic cough, allergic asthma/bronchitis. Failed Xolair FOLLOWS FOR: Pt continues Nucala without complications. Breathing is doing better as well. Reports cough much improved since starting Nucala injections. Was off Symbicort for a couple of months. Primary physician gave sample Breo 200, not clear if that is adding benefit or not. Says this is the longest good stretch with cough that he can remember.  ROS-see HPI Constitutional:   No-   weight loss, night sweats, fevers, chills, fatigue, lassitude. HEENT:   No-  headaches, difficulty swallowing, tooth/dental problems, sore throat,       No-  sneezing, itching, ear ache, + nasal congestion, post nasal drip,  CV:  No-   chest pain, orthopnea, PND, swelling in lower extremities, anasarca,  dizziness, palpitations Resp: No-   shortness of breath with exertion or at rest.              No-   productive cough,  + non-productive cough,  No- coughing up of blood.              No-   change in  color of mucus. + little wheezing.   Skin: No-   rash or lesions. GI:  No-   heartburn, indigestion, abdominal pain, nausea, vomiting,  GU:  MS:  No-   joint pain or swelling.   Neuro-     nothing unusual Psych:  No- change in mood or affect. No depression or anxiety.  No memory loss.  OBJ- Physical Exam  Medium build General- Alert, Oriented, Affect-appropriate, Distress- none acute Skin- rash-none, lesions- none, excoriation- none Lymphadenopathy- none Head- atraumatic            Eyes- Gross vision intact, PERRLA, conjunctivae and secretions clear            Ears- Hearing, canals-normal            Nose- + turbinate edema, no-Septal dev, mucus+watery clear, No-polyps,   or erosion, perforation             Throat- Mallampati II , mucosa+cobblestone , drainage- none, tonsils- atrophic Neck- flexible , trachea midline, no stridor , thyroid nl, carotid no bruit Chest - symmetrical excursion , unlabored           Heart/CV- RRR , no murmur , no gallop  , no rub, nl s1 s2                           - JVD- none , edema- none, stasis changes- none, varices- none           Lung- clear to P&A/unlabored, wheeze-none, cough-none, dullness-none, rub- none           Chest wall-  Abd-  Br/ Gen/ Rectal- Not done, not indicated Extrem- cyanosis- none, clubbing, none, atrophy- none, strength- nl Neuro- grossly intact to observation

## 2015-12-15 NOTE — Assessment & Plan Note (Signed)
Cough equivalent asthma finally significantly improved. Nucala seems to have made the difference. Not sure yet if Breo also helpful. Plan-when Breo sample runs out, stop and watch. If cough builds again we will send prescription. Meanwhile albuterol rescue inhaler refilled

## 2015-12-15 NOTE — Patient Instructions (Signed)
We can continue Nucala-  I'm glad it seems to be helping  Printed script to hold for Breo 200  Suggest you use up the [facility] sample, then watch for awhile to see how you do. If you think you should go back on Breo, then fill the script and start.    I inhalation then rinse mouth, once daily  Script printed refilling your Proair albuterol rescue inhaler

## 2016-01-05 ENCOUNTER — Telehealth: Payer: Self-pay | Admitting: Internal Medicine

## 2016-01-05 ENCOUNTER — Ambulatory Visit (INDEPENDENT_AMBULATORY_CARE_PROVIDER_SITE_OTHER): Payer: Managed Care, Other (non HMO)

## 2016-01-05 DIAGNOSIS — J4540 Moderate persistent asthma, uncomplicated: Secondary | ICD-10-CM

## 2016-01-05 MED ORDER — MEPOLIZUMAB 100 MG ~~LOC~~ SOLR
1.0000 mL | SUBCUTANEOUS | Status: DC
  Administered 2016-01-05: 1 mL via SUBCUTANEOUS

## 2016-01-05 NOTE — Telephone Encounter (Signed)
Spoke with pt. He is aware that we have his Nucala. Nothing further was needed.

## 2016-01-18 ENCOUNTER — Telehealth: Payer: Self-pay | Admitting: Internal Medicine

## 2016-01-18 NOTE — Telephone Encounter (Addendum)
Nucala Vial: 1 Order Date: 01/18/2016  Tried to order today. Pharm. Has not been able to reach Pt., it goes to vm. I also called and lm giving him the pharm. # to call and give premiss. To ship and pay his co-pay. Waiting on pt..  Ship Date:         Vial:1 Arrival Date:  Lot#: Exp.:

## 2016-01-19 ENCOUNTER — Encounter: Payer: Self-pay | Admitting: Internal Medicine

## 2016-01-19 MED ORDER — FLUTICASONE FUROATE-VILANTEROL 200-25 MCG/INH IN AEPB: 1.0000 | INHALATION_SPRAY | Freq: Every day | RESPIRATORY_TRACT | Status: DC

## 2016-01-19 NOTE — Telephone Encounter (Signed)
Rx was sent to [address]  Email sent back to pt to let him know

## 2016-01-25 NOTE — Telephone Encounter (Addendum)
Reached pt. This morning and gave him the # of [employer]  to call. [employer] should be calling us to set up ship. Soon. [employer] just called back. Virginia Crews Vial: 1 Order Date: 01/25/16 Ship Date: 01/26/16  Vial: 1 Arrival Date: 01/27/16 Lot#: [id number] Exp.5/18

## 2016-02-02 ENCOUNTER — Ambulatory Visit (INDEPENDENT_AMBULATORY_CARE_PROVIDER_SITE_OTHER): Payer: Managed Care, Other (non HMO)

## 2016-02-02 DIAGNOSIS — J45901 Unspecified asthma with (acute) exacerbation: Secondary | ICD-10-CM

## 2016-02-03 MED ORDER — MEPOLIZUMAB 100 MG ~~LOC~~ SOLR
100.0000 mg | SUBCUTANEOUS | Status: DC
  Administered 2016-02-02: 100 mg via SUBCUTANEOUS

## 2016-03-01 ENCOUNTER — Ambulatory Visit (INDEPENDENT_AMBULATORY_CARE_PROVIDER_SITE_OTHER): Payer: Managed Care, Other (non HMO)

## 2016-03-01 DIAGNOSIS — J45901 Unspecified asthma with (acute) exacerbation: Secondary | ICD-10-CM | POA: Diagnosis not present

## 2016-03-02 MED ORDER — MEPOLIZUMAB 100 MG ~~LOC~~ SOLR
1.0000 mL | SUBCUTANEOUS | Status: AC
  Administered 2016-03-01: 1 mL via SUBCUTANEOUS

## 2016-03-16 ENCOUNTER — Telehealth: Payer: Self-pay | Admitting: Internal Medicine

## 2016-03-16 NOTE — Telephone Encounter (Addendum)
Nucala  Vials: 1 Order Date: 03/16/16 Ship Date: 03/16/16  Nucala Vials:1 Arrival Date: 03/17/16 Lot#: [id number] Exp.: 6/18

## 2016-03-29 ENCOUNTER — Ambulatory Visit (INDEPENDENT_AMBULATORY_CARE_PROVIDER_SITE_OTHER): Payer: Managed Care, Other (non HMO)

## 2016-03-29 DIAGNOSIS — J45901 Unspecified asthma with (acute) exacerbation: Secondary | ICD-10-CM | POA: Diagnosis not present

## 2016-03-29 MED ORDER — MEPOLIZUMAB 100 MG ~~LOC~~ SOLR
100.0000 mg | SUBCUTANEOUS | Status: DC
  Administered 2016-03-29: 100 mg via SUBCUTANEOUS

## 2016-04-12 ENCOUNTER — Telehealth: Payer: Self-pay | Admitting: Internal Medicine

## 2016-04-12 NOTE — Telephone Encounter (Signed)
Nucala Vials: 1  Order Date: 04/12/16 Ship Date: 04/13/16

## 2016-04-14 ENCOUNTER — Encounter: Payer: Self-pay | Admitting: Internal Medicine

## 2016-04-14 NOTE — Telephone Encounter (Signed)
Nucala Vials: 1  Arrival Date: 04/14/16 Lot#: [id number]  Exp. 04/2017

## 2016-04-26 ENCOUNTER — Ambulatory Visit (INDEPENDENT_AMBULATORY_CARE_PROVIDER_SITE_OTHER): Payer: Managed Care, Other (non HMO)

## 2016-04-26 DIAGNOSIS — J45901 Unspecified asthma with (acute) exacerbation: Secondary | ICD-10-CM | POA: Diagnosis not present

## 2016-04-27 MED ORDER — MEPOLIZUMAB 100 MG ~~LOC~~ SOLR
100.0000 mg | SUBCUTANEOUS | Status: DC
  Administered 2016-04-26: 100 mg via SUBCUTANEOUS

## 2016-05-09 ENCOUNTER — Telehealth: Payer: Self-pay | Admitting: Internal Medicine

## 2016-05-09 NOTE — Telephone Encounter (Signed)
John CrewsNucala Vial: 1 Order Date: 05/09/16 Ship Date: 05/10/16

## 2016-05-11 NOTE — Telephone Encounter (Signed)
Nucala Vial: 1 Arrival Date: 05/11/16 Lot#: [id number] Exp. Date: 05/2017

## 2016-05-24 ENCOUNTER — Ambulatory Visit: Payer: Managed Care, Other (non HMO)

## 2016-05-24 ENCOUNTER — Ambulatory Visit (INDEPENDENT_AMBULATORY_CARE_PROVIDER_SITE_OTHER): Payer: Managed Care, Other (non HMO)

## 2016-05-24 DIAGNOSIS — J45901 Unspecified asthma with (acute) exacerbation: Secondary | ICD-10-CM | POA: Diagnosis not present

## 2016-05-24 MED ORDER — MEPOLIZUMAB 100 MG ~~LOC~~ SOLR
1.0000 mL | SUBCUTANEOUS | Status: AC
  Administered 2016-05-24: 1 mL via SUBCUTANEOUS

## 2016-06-08 ENCOUNTER — Telehealth: Payer: Self-pay | Admitting: Internal Medicine

## 2016-06-08 NOTE — Telephone Encounter (Signed)
Nucala Vial: 1 Arrival Date: 06/08/16 Lot#: [id number] Exp. 07/2017 Sent by fax forgot to put order in epic. My bad.

## 2016-06-13 ENCOUNTER — Encounter: Payer: Self-pay | Admitting: Internal Medicine

## 2016-06-13 ENCOUNTER — Ambulatory Visit (INDEPENDENT_AMBULATORY_CARE_PROVIDER_SITE_OTHER): Payer: Managed Care, Other (non HMO) | Admitting: Internal Medicine

## 2016-06-13 VITALS — BP 130/64 | HR 77 | Ht 68.0 in | Wt 193.6 lb

## 2016-06-13 DIAGNOSIS — J4541 Moderate persistent asthma with (acute) exacerbation: Secondary | ICD-10-CM | POA: Diagnosis not present

## 2016-06-13 NOTE — Patient Instructions (Signed)
We will explore the administrative issues involved in a trial off Nucala, perhaps maintaining Breo maintenance controller.

## 2016-06-13 NOTE — Progress Notes (Signed)
02/25/14- 75 yoM never smoker referred courtesy of Dr Delford Field-? allergies causing cough. Labs were done. Perennial cough x many years. Controlled best with asthma inhalers- Albuterol and Symbicort. Remote singulair no help. Denies seasonal, but triggers include cold weather. Mild nasal stuffiness- no polyps. As a child was allergic to cats. Now has 2 cats- doesn't recognize a problem.  Environment- house, no mold/ dampness. No smokers. 2 cats. +encasings on bedding, +HEPA filter. Uses AllerPet on the cats and an anti-allergy spray on walls and furniture. Home office. No problems with foods, skin rashes, insect stings, aspirin. Mother smoker-COPD. Allergy Profile 11/12/13- Total IgE 99.0, elevations for cat, dog, dust mite, grass pollens, tree pollens, ragweed.   02/26/14- 63 yoM never smoker referred courtesy of Dr Delford Field-? allergies causing cough. Labs were done. No antihistamines, OTC cough syrups, or OTC sleep aids. Here today for allergy skin testing. No acute changes. Allergy skin test 02/26/14- Positive broadly for grass, weed and tree pollens, dust mite, cat and dog.  We discussed allergy vaccine compared with Xolair. He can work more easily with the schedule for Xolair, calculated at 150 mg every 4 weeks ( IgE 99 on 11/12/13, Weight 168 lbs).  04/28/14- 52 yoM never smoker referred courtesy of Dr Delford Field-? allergies causing cough.  No antihistamines, OTC cough syrups, or OTC sleep aids. FOLLOWS FOR:  Reports not coughing as much. Had first xolair shot x3 weeks ago and went well. Rare need for rescue inhaler. No recent acute events.  08/28/14-58 yoM never smoker referred courtesy of Dr Delford Field-? allergies causing cough.  FOLLOWS FOR: Unsure if Xolair is working; continues to have same cough as he did prior to Xolair treatment Cannot tell that Xolair has helped his cough any after 6 month trial. Still feels sense of congestion in his chest most days. Cough is always dry with little  wheeze.  12//3/15- 26 yoM never smoker referred courtesy of Dr Delford Field-? allergies causing cough FOLLOWS FOR: can not tell any difference being off Xolair vs on it; Review PFT in detail with patient. PFT 10/30/14- WNL FEV1/FVC 0.82 Office nose is stuffy but he is breathing better with less cough. Says nasal sprays are not much help. He has been no worse since stopping Xolair which did not help any. He continues on Symbicort. a1AT WNL  05/01/15- 58 yoM never smoker referred courtesy of Dr Delford Field-? allergies causing cough.  Failed Xolair Reports: breathing ok; cough is the issue; dry cough comes and goes;   FOLLOWS FOR: Increased cough since last visit; will start Nucala inj Friday 08-11-15. Stiolto Respimat was no help for his chronic cough. We discussed possible effect of time of year/season. He is not noticing much sneezing, postnasal drip or reflux.  12/15/2015-60 year old male never smoker followed for chronic cough, allergic asthma/bronchitis. Failed Xolair FOLLOWS FOR: Pt continues Nucala without complications. Breathing is doing better as well. Reports cough much improved since starting Nucala injections. Was off Symbicort for a couple of months. Primary physician gave sample Breo 200, not clear if that is adding benefit or not. Says this is the longest good stretch with cough that he can remember.  06/13/2016-60 year old male never smoker followed for chronic cough, allergic asthma/bronchitis. Failed Xolair Started Nucala 08/11/2015 FOLLOW FOR: doing well on Nucala. no concerns. Discussed when we would try reducing or stopping Nucala. He has done very well again over the past 6 months and emphasizes that he had no success with a variety of inhalers, pills, or Xolair. No other life changes to  explain his improvement. Has not needed rescue inhaler. Has used Breo for short intervals a few times when he felt "heavy".  ROS-see HPI Constitutional:   No-   weight loss, night sweats, fevers,  chills, fatigue, lassitude. HEENT:   No-  headaches, difficulty swallowing, tooth/dental problems, sore throat,       No-  sneezing, itching, ear ache, + nasal congestion, post nasal drip,  CV:  No-   chest pain, orthopnea, PND, swelling in lower extremities, anasarca,                                                            dizziness, palpitations Resp: No-   shortness of breath with exertion or at rest.              No-   productive cough,  + non-productive cough,  No- coughing up of blood.              No-   change in color of mucus. + little wheezing.   Skin: No-   rash or lesions. GI:  No-   heartburn, indigestion, abdominal pain, nausea, vomiting,  GU:  MS:  No-   joint pain or swelling.   Neuro-     nothing unusual Psych:  No- change in mood or affect. No depression or anxiety.  No memory loss.  OBJ- Physical Exam  Medium build General- Alert, Oriented, Affect-appropriate, Distress- none acute Skin- rash-none, lesions- none, excoriation- none Lymphadenopathy- none Head- atraumatic            Eyes- Gross vision intact, PERRLA, conjunctivae and secretions clear            Ears- Hearing, canals-normal            Nose- + turbinate edema, no-Septal dev, mucus+watery clear, No-polyps,   or erosion, perforation             Throat- Mallampati II , mucosa+cobblestone , drainage- none, tonsils- atrophic Neck- flexible , trachea midline, no stridor , thyroid nl, carotid no bruit Chest - symmetrical excursion , unlabored           Heart/CV- RRR , no murmur , no gallop  , no rub, nl s1 s2                           - JVD- none , edema- none, stasis changes- none, varices- none           Lung- clear to P&A/unlabored, wheeze-none, cough-none, dullness-none, rub- none           Chest wall-  Abd-  Br/ Gen/ Rectal- Not done, not indicated Extrem- cyanosis- none, clubbing, none, atrophy- none, strength- nl Neuro- grossly intact to observation

## 2016-06-13 NOTE — Assessment & Plan Note (Signed)
Dramatic improvement in the time he is been on Nucala with almost complete control of his chronic cough/asthma. Not needing inhalers routinely. We discussed possibly coming off of Nucala and trying to maintain on Breo.. I will look at whether there are administrative/insurance problems if we stop Nucala and want it restarted after a few months

## 2016-06-21 ENCOUNTER — Ambulatory Visit: Payer: Managed Care, Other (non HMO)

## 2016-06-22 ENCOUNTER — Ambulatory Visit (INDEPENDENT_AMBULATORY_CARE_PROVIDER_SITE_OTHER): Payer: Managed Care, Other (non HMO)

## 2016-06-22 DIAGNOSIS — J45901 Unspecified asthma with (acute) exacerbation: Secondary | ICD-10-CM

## 2016-06-23 MED ORDER — MEPOLIZUMAB 100 MG ~~LOC~~ SOLR
100.0000 mg | SUBCUTANEOUS | Status: DC
  Administered 2016-06-22: 100 mg via SUBCUTANEOUS

## 2016-06-24 ENCOUNTER — Ambulatory Visit: Payer: Managed Care, Other (non HMO)

## 2016-07-13 ENCOUNTER — Telehealth: Payer: Self-pay | Admitting: Internal Medicine

## 2016-07-13 NOTE — Telephone Encounter (Signed)
Vials: 1 Order Date: 07/13/16 Ship Date: 07/14/16

## 2016-07-15 NOTE — Telephone Encounter (Addendum)
Arrival Date: 07/15/16 Vials: 1 Lot #: [id number] Expiration Date: 10/18

## 2016-07-20 ENCOUNTER — Ambulatory Visit (INDEPENDENT_AMBULATORY_CARE_PROVIDER_SITE_OTHER): Payer: Managed Care, Other (non HMO)

## 2016-07-20 DIAGNOSIS — J45901 Unspecified asthma with (acute) exacerbation: Secondary | ICD-10-CM | POA: Diagnosis not present

## 2016-07-20 MED ORDER — MEPOLIZUMAB 100 MG ~~LOC~~ SOLR
100.0000 mg | Freq: Once | SUBCUTANEOUS | Status: AC
  Administered 2016-07-20: 100 mg via SUBCUTANEOUS

## 2016-08-15 ENCOUNTER — Telehealth: Payer: Self-pay | Admitting: Internal Medicine

## 2016-08-15 NOTE — Telephone Encounter (Signed)
Routing to [name] for follow up. 

## 2016-08-16 NOTE — Telephone Encounter (Signed)
Patient called today to cancel appointment for Nucala for 08/17/16. States he will reschedule appointment once he hears back from us whether auth was obtained -pr

## 2016-08-17 ENCOUNTER — Ambulatory Visit: Payer: Managed Care, Other (non HMO)

## 2016-08-17 NOTE — Telephone Encounter (Signed)
Will route to [place] to follow up with pt on the authorization of the Nucala.

## 2016-08-23 NOTE — Telephone Encounter (Signed)
This has been approved sending approval letter.Caren GriffinsStanley A Dalton

## 2016-08-24 NOTE — Telephone Encounter (Signed)
PA has been approved for Nucala injection monthly from 08-22-16 through 08-22-17. Will forward to Dimas Millinammy Scott to ensure she calls Rx to pharmacy for a fill.

## 2016-08-25 ENCOUNTER — Telehealth: Payer: Self-pay | Admitting: Internal Medicine

## 2016-08-25 NOTE — Telephone Encounter (Addendum)
Vials: 1 Order Date: 08/25/16 Ship Date: 08/25/16

## 2016-08-25 NOTE — Telephone Encounter (Signed)
[name] please advise on updates

## 2016-08-26 NOTE — Telephone Encounter (Signed)
Arrival Date: 08/26/16 # of Vials: 1 Lot #: [id number] Expiration Date: 11/18

## 2016-08-31 ENCOUNTER — Ambulatory Visit (INDEPENDENT_AMBULATORY_CARE_PROVIDER_SITE_OTHER): Payer: Managed Care, Other (non HMO)

## 2016-08-31 DIAGNOSIS — J45901 Unspecified asthma with (acute) exacerbation: Secondary | ICD-10-CM

## 2016-08-31 NOTE — Telephone Encounter (Signed)
Sorry I haven't given you a response, it's been crazy over here. His nucala came in, I called him last wk. when his med. Came in,he made his appt. For 08-31-16. Nothing further needed. Closing.

## 2016-09-01 MED ORDER — MEPOLIZUMAB 100 MG ~~LOC~~ SOLR
100.0000 mg | SUBCUTANEOUS | Status: DC
  Administered 2016-08-31: 100 mg via SUBCUTANEOUS

## 2016-09-13 ENCOUNTER — Telehealth: Payer: Self-pay | Admitting: *Deleted

## 2016-09-13 NOTE — Telephone Encounter (Addendum)
Vials: 1 Order Date: 09/13/16 Ship Date: 09/14/16 Had to be reordered b/c pt. Didn't give pharm. Per mission to ship. Not sure when pt. Called pharm. Back.

## 2016-09-26 NOTE — Telephone Encounter (Signed)
Arrival Date: 09/26/16 # of Vials: 1 Lot #: [id number] Expiration Date: 2/19

## 2016-09-28 ENCOUNTER — Ambulatory Visit (INDEPENDENT_AMBULATORY_CARE_PROVIDER_SITE_OTHER): Payer: Managed Care, Other (non HMO)

## 2016-09-28 DIAGNOSIS — J45901 Unspecified asthma with (acute) exacerbation: Secondary | ICD-10-CM

## 2016-09-29 DIAGNOSIS — J45901 Unspecified asthma with (acute) exacerbation: Secondary | ICD-10-CM

## 2016-09-29 MED ORDER — MEPOLIZUMAB 100 MG ~~LOC~~ SOLR
100.0000 mg | SUBCUTANEOUS | Status: DC
  Administered 2016-09-29: 100 mg via SUBCUTANEOUS

## 2016-10-12 ENCOUNTER — Telehealth: Payer: Self-pay | Admitting: Internal Medicine

## 2016-10-12 NOTE — Telephone Encounter (Signed)
Vials: 1 Order Date: 10/12/16 Ship Date: 10/17/16

## 2016-10-18 NOTE — Telephone Encounter (Signed)
Arrival Date: 10/18/16 # of Vials: 1 Lot #: [id number] Expiration Date: 12/2017

## 2016-10-26 ENCOUNTER — Ambulatory Visit (INDEPENDENT_AMBULATORY_CARE_PROVIDER_SITE_OTHER): Payer: Managed Care, Other (non HMO)

## 2016-10-26 DIAGNOSIS — J45901 Unspecified asthma with (acute) exacerbation: Secondary | ICD-10-CM | POA: Diagnosis not present

## 2016-10-27 MED ORDER — MEPOLIZUMAB 100 MG ~~LOC~~ SOLR
100.0000 mg | SUBCUTANEOUS | Status: DC
  Administered 2016-10-26: 100 mg via SUBCUTANEOUS

## 2016-11-08 ENCOUNTER — Telehealth: Payer: Self-pay | Admitting: Internal Medicine

## 2016-11-08 NOTE — Telephone Encounter (Signed)
Vials: 1 Order Date: 11/08/16 Ship Date: 11/09/16

## 2016-11-16 NOTE — Telephone Encounter (Addendum)
Arrival Date: 11/16/16 # of Vials: 1 Lot #: [id number] Expiration Date: 3/19 John Chandler was supposed to have been here 11/10/16. Called 11/15/16 to find out why it wasn't shipped, didn't have pt. Permiss. To ship. I set up ship. With rep for today. I called the pt. And had him call them back to give permiss. To ship,so the ship. Would come in today. Nothing further needed. Closing.

## 2016-11-22 ENCOUNTER — Ambulatory Visit: Payer: Managed Care, Other (non HMO)

## 2016-11-23 ENCOUNTER — Ambulatory Visit (INDEPENDENT_AMBULATORY_CARE_PROVIDER_SITE_OTHER): Payer: Managed Care, Other (non HMO)

## 2016-11-23 DIAGNOSIS — J4531 Mild persistent asthma with (acute) exacerbation: Secondary | ICD-10-CM

## 2016-11-23 MED ORDER — MEPOLIZUMAB 100 MG ~~LOC~~ SOLR
100.0000 mg | SUBCUTANEOUS | Status: DC
  Administered 2016-11-23: 100 mg via SUBCUTANEOUS

## 2016-12-09 ENCOUNTER — Telehealth: Payer: Self-pay | Admitting: Internal Medicine

## 2016-12-09 NOTE — Telephone Encounter (Signed)
Vials: 1 Order Date: 12/09/16 Ship Date: 12/12/16

## 2016-12-12 ENCOUNTER — Encounter: Payer: Self-pay | Admitting: Internal Medicine

## 2016-12-12 ENCOUNTER — Ambulatory Visit (INDEPENDENT_AMBULATORY_CARE_PROVIDER_SITE_OTHER): Payer: Managed Care, Other (non HMO) | Admitting: Internal Medicine

## 2016-12-12 DIAGNOSIS — J3089 Other allergic rhinitis: Secondary | ICD-10-CM

## 2016-12-12 DIAGNOSIS — J4541 Moderate persistent asthma with (acute) exacerbation: Secondary | ICD-10-CM | POA: Diagnosis not present

## 2016-12-12 DIAGNOSIS — J302 Other seasonal allergic rhinitis: Secondary | ICD-10-CM

## 2016-12-12 MED ORDER — FLUTICASONE FUROATE-VILANTEROL 200-25 MCG/INH IN AEPB: 1.0000 | INHALATION_SPRAY | Freq: Every day | RESPIRATORY_TRACT | 11 refills | Status: DC

## 2016-12-12 MED ORDER — ALBUTEROL SULFATE HFA 108 (90 BASE) MCG/ACT IN AERS: 1.0000 | INHALATION_SPRAY | Freq: Four times a day (QID) | RESPIRATORY_TRACT | 12 refills | Status: DC | PRN

## 2016-12-12 NOTE — Progress Notes (Signed)
HPI male never smoker followed for chronic cough, allergic asthma/bronchitis. Failed Xolair Started Nucala 08/11/2015 Allergy Profile 11/12/13- Total IgE 99.0, elevations for cat, dog, dust mite, grass pollens, tree pollens, ragweed.  Allergy skin test 02/26/14- Positive broadly for grass, weed and tree pollens, dust mite, cat and dog.  PFT 10/30/14- WNL FEV1/FVC 0.82 a1AT WNL  -----------------------------------------------------------------------  12/12/2016- 61 year old male never smoker followed for chronic cough, allergic asthma/bronchitis., Complicated by DM 2  Failed Xolair Started Nucala 08/11/2015 FOLLOWS FOR: Pt denies any wheezing, SOB, cough, or congestion at this time. States he is doing well and can a BIG difference with Nucala. We Nucala was delayed to 3 weeks during insurance recertification he began coughing again. Otherwise since on Nucala the cough which had bothered him for many years has been almost completely gone. Rare use of pro-air rescue inhaler.   ROS-see HPI  += pos Constitutional:   No-   weight loss, night sweats, fevers, chills, fatigue, lassitude. HEENT:   No-  headaches, difficulty swallowing, tooth/dental problems, sore throat,       No-  sneezing, itching, ear ache,  nasal congestion, post nasal drip,  CV:  No-   chest pain, orthopnea, PND, swelling in lower extremities, anasarca,                                                            dizziness, palpitations Resp: No-   shortness of breath with exertion or at rest.              No-   productive cough, non-productive cough,  No- coughing up of blood.              No-   change in color of mucus.  Wheezing-rare   Skin: No-   rash or lesions. GI:  No-   heartburn, indigestion, abdominal pain, nausea, vomiting,  GU:  MS:  No-   joint pain or swelling.   Neuro-     nothing unusual Psych:  No- change in mood or affect. No depression or anxiety.  No memory loss.  OBJ- Physical Exam  Medium build General-  Alert, Oriented, Affect-appropriate, Distress- none acute Skin- rash-none, lesions- none, excoriation- none Lymphadenopathy- none Head- atraumatic            Eyes- Gross vision intact, PERRLA, conjunctivae and secretions clear            Ears- Hearing, canals-normal            Nose-  turbinate edema, no-Septal dev, mucus+watery clear, No-polyps,  or erosion, perforation             Throat- Mallampati II , mucosa+cobblestone , drainage- none, tonsils- atrophic Neck- flexible , trachea midline, no stridor , thyroid nl, carotid no bruit Chest - symmetrical excursion , unlabored           Heart/CV- RRR , no murmur , no gallop  , no rub, nl s1 s2                           - JVD- none , edema- none, stasis changes- none, varices- none           Lung- clear to P&A/unlabored, wheeze-none, cough-none, dullness-none, rub- none  Chest wall-  Abd-  Br/ Gen/ Rectal- Not done, not indicated Extrem- cyanosis- none, clubbing, none, atrophy- none, strength- nl Neuro- grossly intact to observation

## 2016-12-12 NOTE — Patient Instructions (Signed)
Glad you are doing so well. We can continue Nucala.  Refills printed for Breo maintenance inhaler and ProAir rescue inhaler to have available if needed.  Please call if we can help

## 2016-12-13 NOTE — Telephone Encounter (Signed)
Arrival Date: 12/13/16 # of Vials: 1 Lot #: [id number]  Expiration Date: 01/2018

## 2016-12-15 NOTE — Assessment & Plan Note (Signed)
Cough equivalent asthma substantially improved on Nucala and recurrent when there was a lapse in Nucala therapy. He is emphatic that he wishes to continue Nucala Plan-continue Nucala. Refilled to hold pro-air rescue inhaler and maintenance Breo Ellipta

## 2016-12-15 NOTE — Assessment & Plan Note (Signed)
No nasal complaints at this visit. Improvement may be seasonal and/or related to Nucala therapy

## 2016-12-21 ENCOUNTER — Ambulatory Visit: Payer: Managed Care, Other (non HMO)

## 2016-12-22 ENCOUNTER — Ambulatory Visit (INDEPENDENT_AMBULATORY_CARE_PROVIDER_SITE_OTHER): Payer: Managed Care, Other (non HMO)

## 2016-12-22 DIAGNOSIS — J45901 Unspecified asthma with (acute) exacerbation: Secondary | ICD-10-CM | POA: Diagnosis not present

## 2016-12-23 MED ORDER — MEPOLIZUMAB 100 MG ~~LOC~~ SOLR
100.0000 mg | SUBCUTANEOUS | Status: DC
  Administered 2016-12-22: 100 mg via SUBCUTANEOUS

## 2016-12-23 NOTE — Progress Notes (Signed)
Documentation and charges have been completed by Lindsay Lemons, CMA based on the Nucala documentation sheet completed by [name].  

## 2017-01-03 ENCOUNTER — Telehealth: Payer: Self-pay | Admitting: Internal Medicine

## 2017-01-03 NOTE — Telephone Encounter (Signed)
Called back and set up del. For 01/17/17. Created order encounter, nothing further needed.

## 2017-01-03 NOTE — Telephone Encounter (Signed)
Vials: 1 Order Date: 01/03/17 Ship Date: 01/16/17

## 2017-01-17 NOTE — Telephone Encounter (Signed)
Arrival Date: 01/17/17 # of Vials: 1 Lot #: [id number]  Expiration Date: 4/19

## 2017-01-19 ENCOUNTER — Ambulatory Visit (INDEPENDENT_AMBULATORY_CARE_PROVIDER_SITE_OTHER): Payer: Managed Care, Other (non HMO)

## 2017-01-19 DIAGNOSIS — J4541 Moderate persistent asthma with (acute) exacerbation: Secondary | ICD-10-CM | POA: Diagnosis not present

## 2017-01-25 MED ORDER — MEPOLIZUMAB 100 MG ~~LOC~~ SOLR
100.0000 mg | Freq: Once | SUBCUTANEOUS | Status: AC
  Administered 2017-01-19: 100 mg via SUBCUTANEOUS

## 2017-02-07 ENCOUNTER — Telehealth: Payer: Self-pay | Admitting: Internal Medicine

## 2017-02-07 NOTE — Telephone Encounter (Signed)
Vials: 1 Order Date: 02/07/17 Ship Date: 02/08/17

## 2017-02-09 NOTE — Telephone Encounter (Signed)
Arrival Date: 02/09/17 # of Vials: 1 Lot #: [id number]  Expiration Date: 4/19

## 2017-02-16 ENCOUNTER — Ambulatory Visit (INDEPENDENT_AMBULATORY_CARE_PROVIDER_SITE_OTHER): Payer: Managed Care, Other (non HMO)

## 2017-02-16 DIAGNOSIS — J4540 Moderate persistent asthma, uncomplicated: Secondary | ICD-10-CM | POA: Diagnosis not present

## 2017-02-17 MED ORDER — MEPOLIZUMAB 100 MG ~~LOC~~ SOLR
100.0000 mg | Freq: Once | SUBCUTANEOUS | Status: AC
  Administered 2017-02-16: 100 mg via SUBCUTANEOUS

## 2017-03-03 ENCOUNTER — Telehealth: Payer: Self-pay | Admitting: Internal Medicine

## 2017-03-08 NOTE — Telephone Encounter (Signed)
Vials: 1 Order Date: 03/03/17 Ship Date: 03/07/17  Forgot to put into epic. My bad.

## 2017-03-08 NOTE — Telephone Encounter (Signed)
Arrival Date: 03/08/17 # of Vials: 1 Lot #: [id number] Expiration Date: 5/19

## 2017-03-16 ENCOUNTER — Ambulatory Visit (INDEPENDENT_AMBULATORY_CARE_PROVIDER_SITE_OTHER): Payer: Managed Care, Other (non HMO)

## 2017-03-16 DIAGNOSIS — J45901 Unspecified asthma with (acute) exacerbation: Secondary | ICD-10-CM | POA: Diagnosis not present

## 2017-03-17 MED ORDER — MEPOLIZUMAB 100 MG ~~LOC~~ SOLR
100.0000 mg | SUBCUTANEOUS | Status: DC
  Administered 2017-03-16: 100 mg via SUBCUTANEOUS

## 2017-03-17 NOTE — Progress Notes (Signed)
Nucala injection documentation and charges entered by Mardella Nuckles, RMA, based on injection sheet filled out by [name] per office protocol.   

## 2017-03-30 ENCOUNTER — Telehealth: Payer: Self-pay | Admitting: Internal Medicine

## 2017-03-30 NOTE — Telephone Encounter (Signed)
Vials: 1 Order Date: 03/30/17 Ship Date: 04/05/17

## 2017-04-06 NOTE — Telephone Encounter (Signed)
Arrival Date: 04/06/17 # of Vials: 1 Lot #: [id number] Expiration Date: 8/20

## 2017-04-13 ENCOUNTER — Ambulatory Visit (INDEPENDENT_AMBULATORY_CARE_PROVIDER_SITE_OTHER): Payer: Managed Care, Other (non HMO)

## 2017-04-13 DIAGNOSIS — J45901 Unspecified asthma with (acute) exacerbation: Secondary | ICD-10-CM

## 2017-04-14 MED ORDER — MEPOLIZUMAB 100 MG ~~LOC~~ SOLR
100.0000 mg | SUBCUTANEOUS | Status: DC
  Administered 2017-04-13: 100 mg via SUBCUTANEOUS

## 2017-04-14 NOTE — Progress Notes (Signed)
Nucala injection documentation and charges entered by Ashley Caulfield, RMA, based on injection sheet filled out by [name] per office protocol.   

## 2017-04-27 ENCOUNTER — Telehealth: Payer: Self-pay | Admitting: Internal Medicine

## 2017-04-27 NOTE — Telephone Encounter (Signed)
Vials: 1 Order Date: 04/27/17 Ship Date: 05/03/17

## 2017-05-04 NOTE — Telephone Encounter (Signed)
Arrival Date: 05/04/17 # of Vials: 1 Lot #: [id number] Expiration Date: 8/20

## 2017-05-11 ENCOUNTER — Ambulatory Visit (INDEPENDENT_AMBULATORY_CARE_PROVIDER_SITE_OTHER): Payer: Managed Care, Other (non HMO)

## 2017-05-11 DIAGNOSIS — J4541 Moderate persistent asthma with (acute) exacerbation: Secondary | ICD-10-CM

## 2017-05-11 MED ORDER — MEPOLIZUMAB 100 MG ~~LOC~~ SOLR
100.0000 mg | SUBCUTANEOUS | Status: DC
  Administered 2017-05-11: 100 mg via SUBCUTANEOUS

## 2017-05-11 NOTE — Progress Notes (Signed)
Documentation of medication administration and charges of Nucala have been completed by Lindsay Lemons, CMA based on the Nucala documentation sheet completed by [name].   

## 2017-05-30 ENCOUNTER — Telehealth: Payer: Self-pay | Admitting: Internal Medicine

## 2017-06-01 ENCOUNTER — Telehealth: Payer: Self-pay | Admitting: Internal Medicine

## 2017-06-01 NOTE — Telephone Encounter (Signed)
Created in error

## 2017-06-01 NOTE — Telephone Encounter (Signed)
Vials: 1 Order Date: 05/30/17 Ship Date: 06/01/17

## 2017-06-02 NOTE — Telephone Encounter (Signed)
Arrival Date: 06/02/17 # of Vials: 1 Lot #: [id number] Expiration Date: 9/20

## 2017-06-08 ENCOUNTER — Ambulatory Visit (INDEPENDENT_AMBULATORY_CARE_PROVIDER_SITE_OTHER): Payer: Managed Care, Other (non HMO)

## 2017-06-08 DIAGNOSIS — J4541 Moderate persistent asthma with (acute) exacerbation: Secondary | ICD-10-CM

## 2017-06-09 MED ORDER — MEPOLIZUMAB 100 MG ~~LOC~~ SOLR
100.0000 mg | SUBCUTANEOUS | Status: DC
  Administered 2017-06-08: 100 mg via SUBCUTANEOUS

## 2017-06-09 NOTE — Progress Notes (Signed)
Documentation of medication administration and charges of Nucala have been completed by Xzavior Reinig, CMA based on the Nucala documentation sheet completed by [name].   

## 2017-06-15 ENCOUNTER — Telehealth: Payer: Self-pay | Admitting: Internal Medicine

## 2017-06-15 NOTE — Telephone Encounter (Signed)
Vials: 1 Order Date: 06/15/17 Ship Date: 06/21/17

## 2017-06-26 ENCOUNTER — Telehealth: Payer: Self-pay | Admitting: Internal Medicine

## 2017-06-26 NOTE — Telephone Encounter (Signed)
We were supposed to receive pt's nucala on 06/22/17. I called to find out the status, there is a co-pay issue and they need his permission to ship also. I called John Chandler to let him know [employer] [facility]. Had been trying to get in touch with him, he said he would call them today. [employer] should call us to set up another del. Date. Waiting.

## 2017-06-26 NOTE — Telephone Encounter (Signed)
Will forward to TS to follow up on the medication nucala.

## 2017-06-27 NOTE — Telephone Encounter (Signed)
Arrival Date: 06/27/17 # of Vials: 1 Lot #: [id number] Expiration Date: 07/2019

## 2017-07-06 ENCOUNTER — Ambulatory Visit (INDEPENDENT_AMBULATORY_CARE_PROVIDER_SITE_OTHER): Payer: Managed Care, Other (non HMO)

## 2017-07-06 DIAGNOSIS — J4541 Moderate persistent asthma with (acute) exacerbation: Secondary | ICD-10-CM | POA: Diagnosis not present

## 2017-07-06 MED ORDER — MEPOLIZUMAB 100 MG ~~LOC~~ SOLR
100.0000 mg | SUBCUTANEOUS | Status: DC
  Administered 2017-07-06: 100 mg via SUBCUTANEOUS

## 2017-07-14 ENCOUNTER — Other Ambulatory Visit: Payer: Self-pay | Admitting: Internal Medicine

## 2017-07-20 ENCOUNTER — Telehealth: Payer: Self-pay | Admitting: Internal Medicine

## 2017-07-20 NOTE — Telephone Encounter (Signed)
Vials: 1 Order Date: 07/20/17 Ship Date: 07/24/17

## 2017-07-25 NOTE — Telephone Encounter (Signed)
Arrival Date: 07/25/17 # of Vials: 1 Lot #: [id number] Expiration Date: 07/2019

## 2017-08-03 ENCOUNTER — Ambulatory Visit (INDEPENDENT_AMBULATORY_CARE_PROVIDER_SITE_OTHER): Payer: Managed Care, Other (non HMO)

## 2017-08-03 DIAGNOSIS — J4540 Moderate persistent asthma, uncomplicated: Secondary | ICD-10-CM | POA: Diagnosis not present

## 2017-08-07 MED ORDER — MEPOLIZUMAB 100 MG ~~LOC~~ SOLR
100.0000 mg | Freq: Once | SUBCUTANEOUS | Status: AC
  Administered 2017-08-03: 100 mg via SUBCUTANEOUS

## 2017-08-21 ENCOUNTER — Telehealth: Payer: Self-pay | Admitting: Internal Medicine

## 2017-08-22 NOTE — Telephone Encounter (Signed)
Arrival Date: 08/22/17 # of Vials: 1 Lot #: [id number] Expiration Date: 07/2019

## 2017-08-22 NOTE — Telephone Encounter (Signed)
Vials: 1 Order Date: 07/21/17 Ship Date: 07/21/17

## 2017-08-31 ENCOUNTER — Ambulatory Visit (INDEPENDENT_AMBULATORY_CARE_PROVIDER_SITE_OTHER): Payer: Managed Care, Other (non HMO)

## 2017-08-31 DIAGNOSIS — J4541 Moderate persistent asthma with (acute) exacerbation: Secondary | ICD-10-CM | POA: Diagnosis not present

## 2017-09-05 MED ORDER — MEPOLIZUMAB 100 MG ~~LOC~~ SOLR
100.0000 mg | Freq: Once | SUBCUTANEOUS | Status: AC
  Administered 2017-08-31: 100 mg via SUBCUTANEOUS

## 2017-09-25 ENCOUNTER — Telehealth: Payer: Self-pay | Admitting: Internal Medicine

## 2017-09-25 NOTE — Telephone Encounter (Signed)
I was filling out Mr. John Chandler's P/A form, and found pt. Has not had a PFT since 10/2014. [employer] asked for a current FEV1 and there isn't one. [name] advised me to put on the form PFT had been ordered not completed. Please order a PFT so we can give them this info. When needed again. Thank you! [name]

## 2017-09-25 NOTE — Telephone Encounter (Signed)
[name]- see [name]'s note- please order PFT

## 2017-09-25 NOTE — Telephone Encounter (Signed)
Spoke with the pt and scheduled the PFT for 10/06/17 at 9 am

## 2017-09-25 NOTE — Telephone Encounter (Signed)
I  faxed his p/a this morning. His appt. Is 09/29/17. I called [employer] to expedite it and their computers were down. I'm going to call them back, they said 1 to 2 hrs..Marland Kitchen

## 2017-09-26 ENCOUNTER — Other Ambulatory Visit: Payer: Self-pay | Admitting: *Deleted

## 2017-09-26 DIAGNOSIS — J4541 Moderate persistent asthma with (acute) exacerbation: Secondary | ICD-10-CM

## 2017-09-27 ENCOUNTER — Telehealth: Payer: Self-pay | Admitting: Internal Medicine

## 2017-09-27 NOTE — Telephone Encounter (Signed)
In case [employer] did not get in touch with him, I left a message asking him to call [employer] and please do so. (Gave ph. #) Nothing further needed.

## 2017-09-27 NOTE — Telephone Encounter (Addendum)
Vials: 1 Order Date: 09/28/17 Ship Date: 10/02/17

## 2017-09-29 ENCOUNTER — Ambulatory Visit: Payer: Managed Care, Other (non HMO)

## 2017-10-03 NOTE — Telephone Encounter (Signed)
Arrival Date: 10/03/17 # of Vials: 1 Lot #: [id number] Expiration Date: 01/2020

## 2017-10-05 ENCOUNTER — Ambulatory Visit (INDEPENDENT_AMBULATORY_CARE_PROVIDER_SITE_OTHER): Payer: Managed Care, Other (non HMO)

## 2017-10-05 DIAGNOSIS — J4541 Moderate persistent asthma with (acute) exacerbation: Secondary | ICD-10-CM | POA: Diagnosis not present

## 2017-10-06 ENCOUNTER — Ambulatory Visit (INDEPENDENT_AMBULATORY_CARE_PROVIDER_SITE_OTHER): Payer: Managed Care, Other (non HMO) | Admitting: Internal Medicine

## 2017-10-06 DIAGNOSIS — J4541 Moderate persistent asthma with (acute) exacerbation: Secondary | ICD-10-CM

## 2017-10-06 LAB — PULMONARY FUNCTION TEST
DL/VA % PRED: 78 %
DL/VA: 3.52 ml/min/mmHg/L
DLCO UNC: 22.19 ml/min/mmHg
DLCO cor % pred: 74 %
DLCO cor: 22 ml/min/mmHg
DLCO unc % pred: 74 %
FEF 25-75 Post: 3.28 L/sec
FEF 25-75 Pre: 2.47 L/sec
FEF2575-%Change-Post: 32 %
FEF2575-%Pred-Post: 120 %
FEF2575-%Pred-Pre: 90 %
FEV1-%Change-Post: 8 %
FEV1-%PRED-POST: 95 %
FEV1-%PRED-PRE: 88 %
FEV1-Post: 3.16 L
FEV1-Pre: 2.92 L
FEV1FVC-%CHANGE-POST: 6 %
FEV1FVC-%Pred-Pre: 104 %
FEV6-%Change-Post: 1 %
FEV6-%PRED-PRE: 89 %
FEV6-%Pred-Post: 90 %
FEV6-POST: 3.8 L
FEV6-PRE: 3.73 L
FEV6FVC-%PRED-POST: 105 %
FEV6FVC-%PRED-PRE: 105 %
FVC-%Change-Post: 1 %
FVC-%PRED-PRE: 85 %
FVC-%Pred-Post: 86 %
FVC-POST: 3.8 L
FVC-PRE: 3.73 L
POST FEV6/FVC RATIO: 100 %
Post FEV1/FVC ratio: 83 %
Pre FEV1/FVC ratio: 78 %
Pre FEV6/FVC Ratio: 100 %
RV % PRED: 149 %
RV: 3.22 L
TLC % PRED: 103 %
TLC: 6.84 L

## 2017-10-06 MED ORDER — MEPOLIZUMAB 100 MG ~~LOC~~ SOLR
100.0000 mg | SUBCUTANEOUS | Status: DC
  Administered 2017-10-05: 100 mg via SUBCUTANEOUS

## 2017-10-06 NOTE — Progress Notes (Signed)
Documentation of medication administration and charges of Nucala have been completed by Lindsay Lemons, CMA based on the Nucala documentation sheet completed by [name].   

## 2017-10-06 NOTE — Progress Notes (Signed)
PFT done today by Gilberto Streck, CMA  

## 2017-10-24 ENCOUNTER — Telehealth: Payer: Self-pay | Admitting: Internal Medicine

## 2017-10-24 NOTE — Telephone Encounter (Signed)
Vials: 1 Order Date: 10/24/17 Ship Date: 10/30/17

## 2017-10-31 NOTE — Telephone Encounter (Signed)
Arrival Date: 10/31/17 # of Vials: 1 Lot #: [id number] Expiration Date: 01/2020

## 2017-11-02 ENCOUNTER — Ambulatory Visit (INDEPENDENT_AMBULATORY_CARE_PROVIDER_SITE_OTHER): Payer: Managed Care, Other (non HMO)

## 2017-11-02 DIAGNOSIS — J4541 Moderate persistent asthma with (acute) exacerbation: Secondary | ICD-10-CM

## 2017-11-03 MED ORDER — MEPOLIZUMAB 100 MG ~~LOC~~ SOLR
100.0000 mg | SUBCUTANEOUS | Status: DC
  Administered 2017-11-02: 100 mg via SUBCUTANEOUS

## 2017-11-03 NOTE — Progress Notes (Signed)
Documentation of medication administration and charges of Nucala have been completed by Winslow Verrill, CMA based on the Nucala documentation sheet completed by [name].   

## 2017-11-22 ENCOUNTER — Telehealth: Payer: Self-pay | Admitting: Internal Medicine

## 2017-11-22 NOTE — Telephone Encounter (Signed)
Will route to TS 

## 2017-11-23 ENCOUNTER — Telehealth: Payer: Self-pay | Admitting: Internal Medicine

## 2017-11-23 NOTE — Telephone Encounter (Signed)
Vials: 1 Order Date: 11/23/17 Ship Date: 11/23/17

## 2017-11-24 NOTE — Telephone Encounter (Signed)
Arrival Date: 11/24/17 # of Vials: 1 Lot #: [id number]  Expiration Date: 01/2020

## 2017-11-27 NOTE — Telephone Encounter (Signed)
[name] please advise if encounter can be closed. Thanks

## 2017-11-27 NOTE — Telephone Encounter (Signed)
Med was called in and received.Nothing further needed.

## 2017-11-30 ENCOUNTER — Ambulatory Visit (INDEPENDENT_AMBULATORY_CARE_PROVIDER_SITE_OTHER): Payer: Managed Care, Other (non HMO)

## 2017-11-30 DIAGNOSIS — J4541 Moderate persistent asthma with (acute) exacerbation: Secondary | ICD-10-CM | POA: Diagnosis not present

## 2017-12-05 MED ORDER — MEPOLIZUMAB 100 MG ~~LOC~~ SOLR
100.0000 mg | SUBCUTANEOUS | Status: DC
  Administered 2017-11-30: 100 mg via SUBCUTANEOUS

## 2017-12-05 NOTE — Progress Notes (Signed)
Documentation of medication administration and charges of Nucala have been completed by Lindsay Lemons, CMA based on the Nucala documentation sheet completed by [name].   

## 2017-12-18 ENCOUNTER — Ambulatory Visit: Payer: Managed Care, Other (non HMO) | Admitting: Internal Medicine

## 2017-12-18 ENCOUNTER — Encounter: Payer: Self-pay | Admitting: Internal Medicine

## 2017-12-18 ENCOUNTER — Telehealth: Payer: Self-pay | Admitting: Internal Medicine

## 2017-12-18 DIAGNOSIS — J4541 Moderate persistent asthma with (acute) exacerbation: Secondary | ICD-10-CM | POA: Diagnosis not present

## 2017-12-18 DIAGNOSIS — K219 Gastro-esophageal reflux disease without esophagitis: Secondary | ICD-10-CM | POA: Diagnosis not present

## 2017-12-18 MED ORDER — ALBUTEROL SULFATE HFA 108 (90 BASE) MCG/ACT IN AERS: 1.0000 | INHALATION_SPRAY | Freq: Four times a day (QID) | RESPIRATORY_TRACT | 3 refills | Status: DC | PRN

## 2017-12-18 NOTE — Progress Notes (Signed)
HPI male never smoker followed for chronic cough, allergic asthma/bronchitis. Failed Xolair Started Nucala 08/11/2015 Allergy Profile 11/12/13- Total IgE 99.0, elevations for cat, dog, dust mite, grass pollens, tree pollens, ragweed.  Allergy skin test 02/26/14- Positive broadly for grass, weed and tree pollens, dust mite, cat and dog.  PFT 10/30/14- WNL FEV1/FVC 0.82 a1AT WNL  -----------------------------------------------------------------------  12/12/2016- 62 year old male never smoker followed for chronic cough, allergic asthma/bronchitis., Complicated by DM 2  Failed Xolair Started Nucala 08/11/2015 FOLLOWS FOR: Pt denies any wheezing, SOB, cough, or congestion at this time. States he is doing well and can a BIG difference with Nucala. We Nucala was delayed to 3 weeks during insurance recertification he began coughing again. Otherwise since on Nucala the cough which had bothered him for many years has been almost completely gone. Rare use of pro-air rescue inhaler.  12/18/17- 62 year old male never smoker followed for chronic cough, allergic asthma/bronchitis., Complicated by DM 2  Failed Xolair Started Nucala 08/11/15 -----Asthmatic bronchitis/ cough: Pt states he is doiung well on Nucala and denies any flare ups at this  time.  Ran out of Nucala pending renewal and began coughing again.  This convinced him that Nucala is still necessary and effective for managing his cough equivalent asthma.  Still occasionally uses a rescue inhaler for mild exacerbation, without sleep disturbance.  ROS-see HPI  += pos Constitutional:   No-   weight loss, night sweats, fevers, chills, fatigue, lassitude. HEENT:   No-  headaches, difficulty swallowing, tooth/dental problems, sore throat,       No-  sneezing, itching, ear ache,  nasal congestion, post nasal drip,  CV:  No-   chest pain, orthopnea, PND, swelling in lower extremities, anasarca,                                   dizziness, palpitations Resp: No-   shortness of breath with exertion or at rest.              No-   productive cough,+ non-productive cough,  No- coughing up of blood.              No-   change in color of mucus.  Wheezing-rare   Skin: No-   rash or lesions. GI:  No-   heartburn, indigestion, abdominal pain, nausea, vomiting,  GU:  MS:  No-   joint pain or swelling.   Neuro-     nothing unusual Psych:  No- change in mood or affect. No depression or anxiety.  No memory loss.  OBJ- Physical Exam  Medium build General- Alert, Oriented, Affect-appropriate, Distress- none acute Skin- rash-none, lesions- none, excoriation- none Lymphadenopathy- none Head- atraumatic            Eyes- Gross vision intact, PERRLA, conjunctivae and secretions clear            Ears- Hearing, canals-normal            Nose-  turbinate edema, no-Septal dev, mucus+watery clear, No-polyps,  or erosion, perforation             Throat- Mallampati II , mucosa+cobblestone , drainage- none, tonsils- atrophic Neck- flexible , trachea midline, no stridor , thyroid nl, carotid no bruit Chest - symmetrical excursion , unlabored           Heart/CV- RRR , no murmur , no gallop  , no rub, nl s1 s2                           -  JVD- none , edema- none, stasis changes- none, varices- none           Lung- clear to P&A/unlabored, wheeze-none, cough-none, dullness-none, rub- none           Chest wall-  Abd-  Br/ Gen/ Rectal- Not done, not indicated Extrem- cyanosis- none, clubbing, none, atrophy- none, strength- nl Neuro- grossly intact to observation

## 2017-12-18 NOTE — Patient Instructions (Signed)
Script sent refilling ProAir rescue inhaler  We can continue Nucala. Glad it has been such a distinct help for you.  Please call if we can help

## 2017-12-19 ENCOUNTER — Telehealth: Payer: Self-pay | Admitting: Internal Medicine

## 2017-12-19 NOTE — Telephone Encounter (Signed)
Vials: 1 Order Date:12/18/2017 Ship Date: 12/18/2017 (Forgot to put order in.)

## 2017-12-19 NOTE — Telephone Encounter (Signed)
Error

## 2017-12-19 NOTE — Telephone Encounter (Signed)
Arrival Date: 12/19/2017 # of Vials: 1 Lot #: [id number] Expiration Date: 01/2020

## 2017-12-28 ENCOUNTER — Ambulatory Visit (INDEPENDENT_AMBULATORY_CARE_PROVIDER_SITE_OTHER): Payer: Managed Care, Other (non HMO)

## 2017-12-28 DIAGNOSIS — J4541 Moderate persistent asthma with (acute) exacerbation: Secondary | ICD-10-CM | POA: Diagnosis not present

## 2017-12-29 MED ORDER — MEPOLIZUMAB 100 MG ~~LOC~~ SOLR
100.0000 mg | SUBCUTANEOUS | Status: DC
Start: 2017-12-28 — End: 2018-08-02
  Administered 2017-12-28: 100 mg via SUBCUTANEOUS

## 2017-12-29 NOTE — Progress Notes (Signed)
Documentation of medication administration and charges of Nucala have been completed by Kemiya Batdorf, CMA based on the Nucala documentation sheet completed by [name].   

## 2018-01-25 ENCOUNTER — Ambulatory Visit: Payer: Managed Care, Other (non HMO)

## 2018-02-05 NOTE — Assessment & Plan Note (Signed)
He has not recognized recent reflux, but we again emphasized reflux precautions.

## 2018-02-05 NOTE — Assessment & Plan Note (Signed)
Cough-equivalent asthma has been well controlled since he began Nucala-very significant improvement over previous experience. Plan-continue Nucala.  Refill pro-air.

## 2018-02-08 ENCOUNTER — Telehealth: Payer: Self-pay | Admitting: Internal Medicine

## 2018-02-08 NOTE — Telephone Encounter (Signed)
1 Vial Order Date: 02/08/18 Shipping Date: 02/08/18

## 2018-02-09 NOTE — Telephone Encounter (Signed)
Arrival Date: 3.15.19 # of Vials: 1 Lot #: [id number]  Expiration Date: 03/2020

## 2018-02-15 ENCOUNTER — Ambulatory Visit (INDEPENDENT_AMBULATORY_CARE_PROVIDER_SITE_OTHER): Payer: Managed Care, Other (non HMO)

## 2018-02-15 DIAGNOSIS — J4540 Moderate persistent asthma, uncomplicated: Secondary | ICD-10-CM

## 2018-02-15 NOTE — Telephone Encounter (Signed)
1 vial Arrival Date:02/09/18 Lot #: [id number] Exp Date:03/2020

## 2018-02-16 MED ORDER — MEPOLIZUMAB 100 MG ~~LOC~~ SOLR
100.0000 mg | Freq: Once | SUBCUTANEOUS | Status: AC
  Administered 2018-02-15: 100 mg via SUBCUTANEOUS

## 2018-02-16 NOTE — Progress Notes (Signed)
nucak

## 2018-03-06 ENCOUNTER — Telehealth: Payer: Self-pay

## 2018-03-06 NOTE — Telephone Encounter (Addendum)
Nucala was ordered but per chart was never documented..  Arrival Date: 03/06/18  # of Vials: 1 Lot #: [id number] Expiration Date: 02/27/2020  Routing to KW as Lorain ChildesFYI.

## 2018-03-15 ENCOUNTER — Ambulatory Visit (INDEPENDENT_AMBULATORY_CARE_PROVIDER_SITE_OTHER): Payer: Managed Care, Other (non HMO)

## 2018-03-15 DIAGNOSIS — J4540 Moderate persistent asthma, uncomplicated: Secondary | ICD-10-CM | POA: Diagnosis not present

## 2018-03-19 MED ORDER — MEPOLIZUMAB 100 MG ~~LOC~~ SOLR
100.0000 mg | SUBCUTANEOUS | Status: DC
  Administered 2018-03-15: 100 mg via SUBCUTANEOUS

## 2018-03-19 NOTE — Progress Notes (Signed)
Nucala injection documentation and charges entered by Khalea Ventura, RMA, based on injection sheet filled out by [name] during preparation and administration. This documentation process is due to office requirements.   

## 2018-04-05 ENCOUNTER — Telehealth: Payer: Self-pay | Admitting: *Deleted

## 2018-04-05 NOTE — Telephone Encounter (Signed)
1 vial Arrival Date:04/05/18 Lot #: [id number] Exp Date:03/2021 Pharm. Didn't cb to confirm del. Once they got pt's permission. I didn't know if nucala was going to be shipped or not. That is why there is no order.

## 2018-04-12 ENCOUNTER — Ambulatory Visit (INDEPENDENT_AMBULATORY_CARE_PROVIDER_SITE_OTHER): Payer: Managed Care, Other (non HMO)

## 2018-04-12 DIAGNOSIS — J4540 Moderate persistent asthma, uncomplicated: Secondary | ICD-10-CM | POA: Diagnosis not present

## 2018-04-12 MED ORDER — MEPOLIZUMAB 100 MG ~~LOC~~ SOLR
100.0000 mg | Freq: Once | SUBCUTANEOUS | Status: AC
  Administered 2018-04-12: 100 mg via SUBCUTANEOUS

## 2018-04-27 ENCOUNTER — Telehealth: Payer: Self-pay | Admitting: Internal Medicine

## 2018-05-01 ENCOUNTER — Telehealth: Payer: Self-pay | Admitting: Internal Medicine

## 2018-05-01 NOTE — Telephone Encounter (Signed)
Called pt. To remind him about Co-pay assistance. He must have sent the fax to [employer], I never got the paperwork.

## 2018-05-01 NOTE — Telephone Encounter (Signed)
1 vial Arrival Date:05/01/18 Lot #: [id number] Exp Date:03/2021 

## 2018-05-10 ENCOUNTER — Ambulatory Visit (INDEPENDENT_AMBULATORY_CARE_PROVIDER_SITE_OTHER): Payer: Managed Care, Other (non HMO)

## 2018-05-10 DIAGNOSIS — J4550 Severe persistent asthma, uncomplicated: Secondary | ICD-10-CM | POA: Diagnosis not present

## 2018-05-11 MED ORDER — MEPOLIZUMAB 100 MG ~~LOC~~ SOLR
100.0000 mg | SUBCUTANEOUS | Status: DC
Start: 2018-05-10 — End: 2018-08-02
  Administered 2018-05-10: 100 mg via SUBCUTANEOUS

## 2018-05-11 NOTE — Progress Notes (Signed)
Documentation of medication administration and charges of Nucala have been completed by Lindsay Lemons, CMA based on the Nucala documentation sheet completed by [name].   

## 2018-05-21 ENCOUNTER — Telehealth: Payer: Self-pay | Admitting: *Deleted

## 2018-05-21 NOTE — Telephone Encounter (Signed)
1 Vial Order Date: 05/21/18 Shipping Date: Pt. Request med be del to us 05/25/18, it will ship 05/24/18.

## 2018-05-21 NOTE — Telephone Encounter (Signed)
Will route message to TS to follow up on.

## 2018-05-25 NOTE — Telephone Encounter (Signed)
1 vial Arrival Date:05/25/18 Lot #: [id number] Exp Date:11/2021

## 2018-06-07 ENCOUNTER — Ambulatory Visit (INDEPENDENT_AMBULATORY_CARE_PROVIDER_SITE_OTHER): Payer: Managed Care, Other (non HMO)

## 2018-06-07 DIAGNOSIS — J4550 Severe persistent asthma, uncomplicated: Secondary | ICD-10-CM | POA: Diagnosis not present

## 2018-06-08 MED ORDER — MEPOLIZUMAB 100 MG ~~LOC~~ SOLR
100.0000 mg | Freq: Once | SUBCUTANEOUS | Status: AC
  Administered 2018-06-07: 100 mg via SUBCUTANEOUS

## 2018-06-14 ENCOUNTER — Telehealth: Payer: Self-pay | Admitting: Internal Medicine

## 2018-06-14 NOTE — Telephone Encounter (Signed)
1 Vial Order Date: 06/14/18 Shipping Date: 06/18/18

## 2018-06-19 NOTE — Telephone Encounter (Signed)
Arrival Date: 06/19/18 # of Vials: 1 Lot #: [id number] Expiration Date: 11/2021

## 2018-07-05 ENCOUNTER — Ambulatory Visit (INDEPENDENT_AMBULATORY_CARE_PROVIDER_SITE_OTHER): Payer: Managed Care, Other (non HMO)

## 2018-07-05 DIAGNOSIS — J4541 Moderate persistent asthma with (acute) exacerbation: Secondary | ICD-10-CM

## 2018-07-06 DIAGNOSIS — J4541 Moderate persistent asthma with (acute) exacerbation: Secondary | ICD-10-CM

## 2018-07-06 MED ORDER — MEPOLIZUMAB 100 MG ~~LOC~~ SOLR
100.0000 mg | SUBCUTANEOUS | Status: DC
  Administered 2018-07-06: 100 mg via SUBCUTANEOUS

## 2018-07-06 NOTE — Progress Notes (Signed)
Documentation of medication administration and charges of Nucala have been completed by Nelsy Madonna, CMA based on the Nucala documentation sheet completed by [name].   

## 2018-07-10 ENCOUNTER — Telehealth: Payer: Self-pay | Admitting: Internal Medicine

## 2018-07-10 NOTE — Telephone Encounter (Signed)
1 Vial Order Date: 07/10/18 Shipping Date: 07/30/18

## 2018-07-31 NOTE — Telephone Encounter (Signed)
1 vial Arrival Date:07/31/18 Lot #: [id number] Exp Date:11/2021

## 2018-08-02 ENCOUNTER — Ambulatory Visit (INDEPENDENT_AMBULATORY_CARE_PROVIDER_SITE_OTHER): Payer: Managed Care, Other (non HMO)

## 2018-08-02 DIAGNOSIS — J4550 Severe persistent asthma, uncomplicated: Secondary | ICD-10-CM | POA: Diagnosis not present

## 2018-08-02 MED ORDER — MEPOLIZUMAB 100 MG ~~LOC~~ SOLR
100.0000 mg | Freq: Once | SUBCUTANEOUS | Status: AC
  Administered 2018-08-02: 100 mg via SUBCUTANEOUS

## 2018-08-27 ENCOUNTER — Telehealth: Payer: Self-pay | Admitting: Internal Medicine

## 2018-08-27 NOTE — Telephone Encounter (Signed)
[employer]. Sent me a rx it is on CY's cart to be signed. I will fax it as soon as he signs it. I'm also waiting on an approval for his P/A. Computer is still submitting P/A.

## 2018-08-29 NOTE — Telephone Encounter (Signed)
John Chandler, please see patient's mychart message as he says his nucala Rx has expired.  Please advise on this for pt. Thanks!

## 2018-08-30 ENCOUNTER — Ambulatory Visit: Payer: Managed Care, Other (non HMO)

## 2018-08-30 ENCOUNTER — Encounter: Payer: Self-pay | Admitting: *Deleted

## 2018-08-30 NOTE — Telephone Encounter (Signed)
[name], please advise if you have an update on pt's med.  Thanks! 

## 2018-08-31 ENCOUNTER — Encounter: Payer: Self-pay | Admitting: *Deleted

## 2018-08-31 NOTE — Telephone Encounter (Signed)
[employer] did receive pt's rx. I called and was able to set up delivery. It came in today. 1 vial Arrival Date: 08/31/2018 Lot #: [id number] Exp Date:01/2022

## 2018-09-05 ENCOUNTER — Telehealth: Payer: Self-pay | Admitting: Internal Medicine

## 2018-09-05 NOTE — Telephone Encounter (Signed)
I called pt, [email or web address] and please resch, his appt. At either 12:30 or after 2:00.  I explained I won't be here and there is a sch. Conflict with the person covering me. Please cb and resch for one of those times. Will wait to hear from pt..Routing to Florentina Addison to make her aware.

## 2018-09-06 ENCOUNTER — Ambulatory Visit (INDEPENDENT_AMBULATORY_CARE_PROVIDER_SITE_OTHER): Payer: Managed Care, Other (non HMO)

## 2018-09-06 DIAGNOSIS — J4550 Severe persistent asthma, uncomplicated: Secondary | ICD-10-CM

## 2018-09-06 MED ORDER — MEPOLIZUMAB 100 MG ~~LOC~~ SOLR
100.0000 mg | Freq: Once | SUBCUTANEOUS | Status: AC
  Administered 2018-09-06: 100 mg via SUBCUTANEOUS

## 2018-09-06 NOTE — Telephone Encounter (Signed)
Nothing needed for apt. I have spoken to patient this morning and will close encounter

## 2018-09-21 ENCOUNTER — Telehealth: Payer: Self-pay | Admitting: Internal Medicine

## 2018-09-21 NOTE — Telephone Encounter (Signed)
Will route to John Chandler to follow up 

## 2018-09-21 NOTE — Telephone Encounter (Signed)
This message is a duplicate. Please refer to the original message sent by Ronnald Collum for updates. Closing.

## 2018-09-21 NOTE — Telephone Encounter (Signed)
1 Vial Order Date: 09/21/18 Shipping Date: 09/24/18

## 2018-09-25 NOTE — Telephone Encounter (Signed)
1 vial Arrival Date:09/25/18 Lot #: [id number] Exp Date:01/2020 

## 2018-09-25 NOTE — Telephone Encounter (Signed)
1 vial Arrival Date: 09/25/18 Lot #: [id number] Exp Date: 02/2022

## 2018-10-04 ENCOUNTER — Ambulatory Visit (INDEPENDENT_AMBULATORY_CARE_PROVIDER_SITE_OTHER): Payer: 59

## 2018-10-04 DIAGNOSIS — J4550 Severe persistent asthma, uncomplicated: Secondary | ICD-10-CM

## 2018-10-05 MED ORDER — MEPOLIZUMAB 100 MG ~~LOC~~ SOLR
100.0000 mg | Freq: Once | SUBCUTANEOUS | Status: AC
  Administered 2018-10-04: 100 mg via SUBCUTANEOUS

## 2018-10-30 ENCOUNTER — Telehealth: Payer: Self-pay | Admitting: *Deleted

## 2018-10-30 NOTE — Telephone Encounter (Signed)
1 vial Arrival Date:10/30/18 Lot #: [id number] Exp Date:03/2022  With the move and the Holiday I didn't get a chance to put the order in.

## 2018-11-01 ENCOUNTER — Ambulatory Visit (INDEPENDENT_AMBULATORY_CARE_PROVIDER_SITE_OTHER): Payer: 59

## 2018-11-01 DIAGNOSIS — J4550 Severe persistent asthma, uncomplicated: Secondary | ICD-10-CM | POA: Diagnosis not present

## 2018-11-02 MED ORDER — MEPOLIZUMAB 100 MG ~~LOC~~ SOLR
100.0000 mg | Freq: Once | SUBCUTANEOUS | Status: AC
  Administered 2018-11-01: 100 mg via SUBCUTANEOUS

## 2018-11-27 ENCOUNTER — Telehealth: Payer: Self-pay | Admitting: Internal Medicine

## 2018-11-27 NOTE — Telephone Encounter (Signed)
Called pt to let him know his Nucala won't be here til 11/30/18. Ask pt to call back Thurs. And rsc his appt.

## 2018-11-27 NOTE — Telephone Encounter (Signed)
1 Vial Order Date: 11/27/18 Shipping Date: 11/30/17

## 2018-11-29 ENCOUNTER — Ambulatory Visit: Payer: 59

## 2018-11-30 ENCOUNTER — Ambulatory Visit: Payer: 59

## 2018-11-30 NOTE — Telephone Encounter (Signed)
1 vial Arrival Date:11/30/2018 Lot #: [id number] Exp Date:03/2022

## 2018-12-06 ENCOUNTER — Ambulatory Visit (INDEPENDENT_AMBULATORY_CARE_PROVIDER_SITE_OTHER): Payer: 59

## 2018-12-06 ENCOUNTER — Ambulatory Visit: Payer: 59

## 2018-12-06 DIAGNOSIS — J4550 Severe persistent asthma, uncomplicated: Secondary | ICD-10-CM

## 2018-12-10 MED ORDER — MEPOLIZUMAB 100 MG ~~LOC~~ SOLR
100.0000 mg | Freq: Once | SUBCUTANEOUS | Status: AC
  Administered 2018-12-06: 100 mg via SUBCUTANEOUS

## 2018-12-20 ENCOUNTER — Encounter: Payer: Self-pay | Admitting: Internal Medicine

## 2018-12-20 ENCOUNTER — Ambulatory Visit: Payer: 59 | Admitting: Internal Medicine

## 2018-12-20 VITALS — BP 118/70 | HR 65 | Ht 68.0 in | Wt 202.8 lb

## 2018-12-20 DIAGNOSIS — J4550 Severe persistent asthma, uncomplicated: Secondary | ICD-10-CM | POA: Diagnosis not present

## 2018-12-20 DIAGNOSIS — J3089 Other allergic rhinitis: Secondary | ICD-10-CM

## 2018-12-20 DIAGNOSIS — J302 Other seasonal allergic rhinitis: Secondary | ICD-10-CM

## 2018-12-20 DIAGNOSIS — J4541 Moderate persistent asthma with (acute) exacerbation: Secondary | ICD-10-CM

## 2018-12-20 LAB — CBC WITH DIFFERENTIAL/PLATELET
BASOS PCT: 0.5 % (ref 0.0–3.0)
Basophils Absolute: 0 10*3/uL (ref 0.0–0.1)
Eosinophils Absolute: 0.1 10*3/uL (ref 0.0–0.7)
Eosinophils Relative: 1.2 % (ref 0.0–5.0)
HCT: 46.1 % (ref 39.0–52.0)
Hemoglobin: 15 g/dL (ref 13.0–17.0)
Lymphocytes Relative: 18.2 % (ref 12.0–46.0)
Lymphs Abs: 1.4 10*3/uL (ref 0.7–4.0)
MCHC: 32.7 g/dL (ref 30.0–36.0)
MCV: 83.3 fl (ref 78.0–100.0)
Monocytes Absolute: 0.8 10*3/uL (ref 0.1–1.0)
Monocytes Relative: 11 % (ref 3.0–12.0)
NEUTROS ABS: 5.3 10*3/uL (ref 1.4–7.7)
Neutrophils Relative %: 69.1 % (ref 43.0–77.0)
Platelets: 246 10*3/uL (ref 150.0–400.0)
RBC: 5.53 Mil/uL (ref 4.22–5.81)
RDW: 15 % (ref 11.5–15.5)
WBC: 7.6 10*3/uL (ref 4.0–10.5)

## 2018-12-20 MED ORDER — ALBUTEROL SULFATE HFA 108 (90 BASE) MCG/ACT IN AERS: 1.0000 | INHALATION_SPRAY | Freq: Four times a day (QID) | RESPIRATORY_TRACT | 3 refills | Status: DC | PRN

## 2018-12-20 NOTE — Patient Instructions (Addendum)
Order- Application for Harrington Challenger to replace Nucala   Dx asthmatic bronchitis severe   Script printed for ProAir refill  Please call if we can help  Order- lab- CBC w diff, IgE

## 2018-12-20 NOTE — Progress Notes (Signed)
HPI male never smoker followed for chronic cough, allergic asthma/bronchitis. Failed Xolair Started Nucala 08/11/2015 Allergy Profile 11/12/13- Total IgE 99.0, elevations for cat, dog, dust mite, grass pollens, tree pollens, ragweed.  Allergy skin test 02/26/14- Positive broadly for grass, weed and tree pollens, dust mite, cat and dog.  PFT 10/30/14- WNL FEV1/FVC 0.82 a1AT WNL  -----------------------------------------------------------------------  12/18/17- 63 year old male never smoker followed for chronic cough, allergic asthma/bronchitis., Complicated by DM 2  Failed Xolair Started Nucala 08/11/15 -----Asthmatic bronchitis/ cough: Pt states he is doiung well on Nucala and denies any flare ups at this  time.  Ran out of Nucala pending renewal and began coughing again.  This convinced him that Nucala is still necessary and effective for managing his cough equivalent asthma.  Still occasionally uses a rescue inhaler for mild exacerbation, without sleep disturbance.  12/20/2018- 63 year old male never smoker followed for chronic cough, allergic asthma/bronchitis., Complicated by DM 2  Failed Xolair Nucala 08/11/15- 12/20/2020. But Harrington Challenger now preferred by his insurance. Asthma: Pt states Nucala is working well for him-having issues with cost mediacation of -----Nucala-Insurance will cover Fasenra at a better cost  Pro-air HFA, When he ran out of Nucalla last year he experienced progressive cough similar to what he had before starting biologic therapy.  He is now wanting to switch to Harrington Challenger because his insurance covers much better. Using rescue inhaler about twice a week.  ROS-see HPI  += positive Constitutional:   No-   weight loss, night sweats, fevers, chills, fatigue, lassitude. HEENT:   No-  headaches, difficulty swallowing, tooth/dental problems, sore throat,       No-  sneezing, itching, ear ache,  nasal congestion, post nasal drip,  CV:  No-   chest pain, orthopnea, PND, swelling in  lower extremities, anasarca,                                                         dizziness, palpitations Resp: No-   shortness of breath with exertion or at rest.              No-   productive cough,+ non-productive cough,  No- coughing up of blood.              No-   change in color of mucus.  Wheezing-rare   Skin: No-   rash or lesions. GI:  No-   heartburn, indigestion, abdominal pain, nausea, vomiting,  GU:  MS:  No-   joint pain or swelling.   Neuro-     nothing unusual Psych:  No- change in mood or affect. No depression or anxiety.  No memory loss.  OBJ- Physical Exam  Medium build General- Alert, Oriented, Affect-appropriate, Distress- none acute Skin- rash-none, lesions- none, excoriation- none Lymphadenopathy- none Head- atraumatic            Eyes- Gross vision intact, PERRLA, conjunctivae and secretions clear            Ears- Hearing, canals-normal            Nose-  turbinate edema, no-Septal dev, mucus+watery clear, No-polyps,  or erosion, perforation             Throat- Mallampati II , mucosa+cobblestone , drainage- none, tonsils- atrophic Neck- flexible , trachea midline, no stridor , thyroid nl, carotid no bruit Chest -  symmetrical excursion , unlabored           Heart/CV- RRR , no murmur , no gallop  , no rub, nl s1 s2                           - JVD- none , edema- none, stasis changes- none, varices- none           Lung- clear to P&A/unlabored, wheeze-none, cough-none, dullness-none, rub- none           Chest wall-  Abd-  Br/ Gen/ Rectal- Not done, not indicated Extrem- cyanosis- none, clubbing, none, atrophy- none, strength- nl Neuro- grossly intact to observation

## 2018-12-21 LAB — IGE: IgE (Immunoglobulin E), Serum: 184 kU/L — ABNORMAL HIGH (ref <=114)

## 2018-12-26 NOTE — Assessment & Plan Note (Signed)
Anticipate need to resume use of Flonase and antihistamines with spring pollen.  Additional therapy if needed.

## 2018-12-26 NOTE — Assessment & Plan Note (Signed)
Cough equivalent asthma, moderate persistent, uncomplicated.  Very good control on biologic therapy.  Relapsed symptoms when therapy was dropped for an interval. Plan-change from Nucala to [place]ough per insurance Refill rescue inhaler

## 2019-01-03 ENCOUNTER — Ambulatory Visit: Payer: 59

## 2019-01-09 ENCOUNTER — Telehealth: Payer: Self-pay | Admitting: Internal Medicine

## 2019-01-09 NOTE — Telephone Encounter (Signed)
No questions at this time-will close message. [name] and I are working together to get patient the information and get him started asap

## 2019-01-23 ENCOUNTER — Telehealth: Payer: Self-pay | Admitting: Internal Medicine

## 2019-01-29 NOTE — Telephone Encounter (Signed)
Patient has sent a e mail stating that he has a new insurance.  He has a new card enclosed.  Message from Patient  My company has switched medical providers.It is now [employer] and my new ID card is attached. My new Harrington Challenger prescription will need to be filled and authorized by them.  Message routed to Injection PA (unable to route to injection PA pool)  Message routed to [place] S, to route

## 2019-01-31 NOTE — Telephone Encounter (Signed)
PA was needed for pt's Fasenra. Form has been filled out and faxed to [employer]. Will await PA decision.

## 2019-02-04 NOTE — Telephone Encounter (Signed)
Received a fax from [place]. Pt did not need a PA for his Harrington Challenger. He was already approved for 08/29/2018 - 08/29/2019.

## 2019-02-08 ENCOUNTER — Telehealth: Payer: Self-pay | Admitting: Internal Medicine

## 2019-02-08 NOTE — Telephone Encounter (Addendum)
Called [address] [place] at [phone]. Spoke with Mia. Advised that I would need to be transferred to the prior authorization department [phone]). PA for Harrington Challenger could not be completed over the phone. A form will be faxed to [place]. Will await fax.

## 2019-02-11 NOTE — Telephone Encounter (Signed)
Will route over to [place] for help on this, please advise, thank you.

## 2019-02-13 ENCOUNTER — Telehealth: Payer: Self-pay | Admitting: Internal Medicine

## 2019-02-13 NOTE — Telephone Encounter (Signed)
We received a MyChart message from the pt on 01/29/2019 letting us know that his insurance had changed and he would need a new prior authorization for Fasenra.  Please see my documentation below from the pt's MyChart message.   02/08/2019 Called [employer] at [phone]. Spoke with Mia. Advised that I would need to be transferred to the prior authorization department [phone]). PA for Harrington Challenger could not be completed over the phone. A form will be faxed to [place]. Will await fax.    ------------------------------------- 02/13/2019 Called pt's insurance company at a different number >> [phone]. I spoke with Candace. Pt's PA has been started over the phone and will take up to 24-72 hours for a determination. Will await PA decision.

## 2019-02-13 NOTE — Telephone Encounter (Signed)
Called pt's insurance company at a different number >> [phone]. I spoke with Candace. Pt's PA has been started over the phone and will take up to 24-72 hours for a determination. Will await PA decision.

## 2019-02-18 NOTE — Telephone Encounter (Signed)
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  His contract is in the renewal process with [employer], so that may be why he is showing up as not in network at the moment, but it should not affect any claims filed.

## 2019-02-18 NOTE — Telephone Encounter (Signed)
Patient send MyChart e-mail to office requesting refills on his Janument, Lexapro, Jardiance, Crestor, Actos and Levemir insulin Patient sees Dr Maple Hudson for asthma E-mail sent to patient recommending that he forward this request to his PCP

## 2019-02-19 NOTE — Telephone Encounter (Signed)
Called the patient and advised of the response below. Patient voiced understanding. Nothing further needed at this time.

## 2019-02-25 NOTE — Telephone Encounter (Signed)
John Chandler is calling back [phone]

## 2019-02-25 NOTE — Telephone Encounter (Signed)
Will route to the injection pool for follow up.

## 2019-02-26 NOTE — Telephone Encounter (Signed)
Called pt's insurance and spoke with [place]. They are needing the dose, frequency and quantity for Fasenra. This information has been given to [place]. Information has been submitted for clinical review. Decision has a 3 business day turn around time. Will await decision.

## 2019-02-26 NOTE — Telephone Encounter (Signed)
Received fax from [facility]. Harrington Challenger has been approved 02/14/2019 - 02/13/2020.

## 2019-02-27 ENCOUNTER — Telehealth: Payer: Self-pay | Admitting: Internal Medicine

## 2019-02-27 NOTE — Telephone Encounter (Signed)
Called [facility] at [phone]. Spoke with several representatives. Pt's John Chandler has been approved through his insurance through 02/13/2020. When I attempted to order and ship the pt's medication, I was told that they do not have this authorization on file. [facility] is going to reach out to the pt's insurance and see what's going on.

## 2019-03-04 NOTE — Telephone Encounter (Signed)
I tried to order pt's Fasenra, [facility] Told me the same thing. I got Benefits & verification dept. Fax#: [phone].  Waiting to hear from B & [facility] Dept.Marland Kitchen

## 2019-03-05 NOTE — Telephone Encounter (Signed)
Will route to injection pool.  Please advise. thanks

## 2019-03-13 ENCOUNTER — Telehealth: Payer: Self-pay | Admitting: Internal Medicine

## 2019-03-13 NOTE — Telephone Encounter (Signed)
Called [facility] at [phone]. Spoke with [place]. I have given her verbal consent to change this prescription. Nothing further was needed.

## 2019-04-04 ENCOUNTER — Telehealth: Payer: Self-pay | Admitting: Internal Medicine

## 2019-04-04 NOTE — Telephone Encounter (Signed)
[facility]ed [facility].  Harrington Challenger Order: 30mg  #1 prefilled syringe Ordered date: 04/04/2019 Expected date of arrival: 04/09/2019 Ordered by: Jaynee Eagles, CMA  [facility]

## 2019-04-09 ENCOUNTER — Telehealth: Payer: Self-pay | Admitting: *Deleted

## 2019-04-09 NOTE — Telephone Encounter (Signed)
Fasenra Shipment Received:  30mg  #1 Autoinjector  NDC#: [id number] Medication arrival date: 04/09/2019 Lot #: [id number] Exp date: 07/2020 Received by: Darlyne Russian

## 2019-04-09 NOTE — Telephone Encounter (Signed)
The [place] autoinjector is given in the home. Pt received his 1st inj. At home and was waiting on this one. Pt's ins changed so we had to do a new P/A and autoinjector was sent to [place] by mistake. Pt is going to pick med up around 12:00 04/10/2019. I explained to the pt I would call the pharm and have them send it to his home from now on.( confirmed pt's address) I am talking to a rep now. Well, I'm on hold. He's checking with [facility] Escalation team to ask them the best way to handle this. [place] a Sr. Rep for their escalation team confirmed pt's address. Pt's autoinjector will be sent to pt's home from now on. Nothing further needed.

## 2019-04-15 ENCOUNTER — Other Ambulatory Visit: Payer: Self-pay | Admitting: Internal Medicine

## 2019-06-04 ENCOUNTER — Other Ambulatory Visit: Payer: Self-pay | Admitting: Internal Medicine

## 2019-10-02 ENCOUNTER — Other Ambulatory Visit: Payer: Self-pay | Admitting: Internal Medicine

## 2019-10-17 ENCOUNTER — Other Ambulatory Visit: Payer: Self-pay | Admitting: Internal Medicine

## 2019-12-23 ENCOUNTER — Ambulatory Visit: Payer: 59 | Admitting: Internal Medicine

## 2020-01-24 ENCOUNTER — Other Ambulatory Visit: Payer: Self-pay

## 2020-01-24 ENCOUNTER — Ambulatory Visit: Payer: BLUE CROSS/BLUE SHIELD | Admitting: Internal Medicine

## 2020-01-24 ENCOUNTER — Encounter: Payer: Self-pay | Admitting: Internal Medicine

## 2020-01-24 DIAGNOSIS — J4540 Moderate persistent asthma, uncomplicated: Secondary | ICD-10-CM | POA: Diagnosis not present

## 2020-01-24 NOTE — Patient Instructions (Signed)
I'm really glad John Chandler has worked so well for you.   Please call if we can help

## 2020-01-24 NOTE — Progress Notes (Signed)
HPI male never smoker followed for chronic cough, allergic asthma/bronchitis.  Failed Xolair Started Nucala 08/11/2015, changed to [place] for insurance 2020 Allergy Profile 11/12/13- Total IgE 99.0, elevations for cat, dog, dust mite, grass pollens, tree pollens, ragweed.  Allergy skin test 02/26/14- Positive broadly for grass, weed and tree pollens, dust mite, cat and dog.  PFT 10/30/14- WNL FEV1/FVC 0.82 a1AT WNL   2015  -----------------------------------------------------------------------  12/20/2018- 64 year old male never smoker followed for chronic cough, allergic asthma/bronchitis., Complicated by DM 2  Failed Xolair. Nucala 08/11/15- 12/20/2018. But Harrington Challenger now preferred by his insurance. Asthma: Pt states Nucala is working well for him-having issues with cost mediacation of -----Nucala-Insurance will cover Fasenra at a better cost  Pro-air HFA, When he ran out of Nucalla last year he experienced progressive cough similar to what he had before starting biologic therapy.  He is now wanting to switch to Harrington Challenger because his insurance covers much better. Using rescue inhaler about twice a week.  01/24/20- 64 year old male never smoker followed for chronic cough, allergic asthma/bronchitis., Complicated by DM 2, Migraine, GERD,  Failed Xolair, but did very well with Nucala 913/16- 12/20/2018. Changed to Harrington Challenger 2020 for better insurance coverage- uses every 8 weeks.  Proair HFA,  -----f/u Severe persistent asthma without complication. Breathing is at his baseline. Well pleased with stable control on Fasenra. He doesn't want any change. Much less need for rescue inhaler.    ROS-see HPI  += positive Constitutional:   No-   weight loss, night sweats, fevers, chills, fatigue, lassitude. HEENT:   No-  headaches, difficulty swallowing, tooth/dental problems, sore throat,       No-  sneezing, itching, ear ache,  nasal congestion, post nasal drip,  CV:  No-   chest pain, orthopnea, PND, swelling  in lower extremities, anasarca,                                                         dizziness, palpitations Resp: No-   shortness of breath with exertion or at rest.              No-   productive cough,+ non-productive cough,  No- coughing up of blood.              No-   change in color of mucus.  Wheezing-rare   Skin: No-   rash or lesions. GI:  No-   heartburn, indigestion, abdominal pain, nausea, vomiting,  GU:  MS:  No-   joint pain or swelling.   Neuro-     nothing unusual Psych:  No- change in mood or affect. No depression or anxiety.  No memory loss.  OBJ- Physical Exam  Medium build General- Alert, Oriented, Affect-appropriate, Distress- none acute Skin- rash-none, lesions- none, excoriation- none Lymphadenopathy- none Head- atraumatic            Eyes- Gross vision intact, PERRLA, conjunctivae and secretions clear            Ears- Hearing, canals-normal            Nose-  turbinate edema, no-Septal dev, mucus+watery clear, No-polyps,  or erosion, perforation             Throat- Mallampati II , mucosa+cobblestone , drainage- none, tonsils- atrophic Neck- flexible , trachea midline, no stridor , thyroid nl, carotid  no bruit Chest - symmetrical excursion , unlabored           Heart/CV- RRR , no murmur , no gallop  , no rub, nl s1 s2                           - JVD- none , edema- none, stasis changes- none, varices- none           Lung- clear to P&A/unlabored, wheeze-none, cough-none, dullness-none, rub- none           Chest wall-  Abd-  Br/ Gen/ Rectal- Not done, not indicated Extrem- cyanosis- none, clubbing, none, atrophy- none, strength- nl Neuro- grossly intact to observation

## 2020-01-24 NOTE — Assessment & Plan Note (Signed)
Harrington Challenger very effective and maintaining good control  Plan continue Fasenra. Discussed medication use. Watch transition to pollen season coming very soon.

## 2020-02-10 ENCOUNTER — Telehealth: Payer: Self-pay | Admitting: Internal Medicine

## 2020-02-10 NOTE — Telephone Encounter (Signed)
PA has been approved through 02/10/2020 - 02/09/2021.

## 2020-02-10 NOTE — Telephone Encounter (Signed)
[facility] Pt's Harrington Challenger PA has expired. Renewal PA has been started on Cover My Meds. Key: [id number]. Will follow up.

## 2020-02-12 NOTE — Telephone Encounter (Signed)
Rep from [facility] called stating medication has been approved and confirmed the same dates Lillia Abed has documented. The auth number is [id number]. Her call back number is [phone] if needed. Nothing further needed at the time of the call.

## 2020-04-16 ENCOUNTER — Telehealth: Payer: Self-pay | Admitting: Internal Medicine

## 2020-04-16 DIAGNOSIS — J4541 Moderate persistent asthma with (acute) exacerbation: Secondary | ICD-10-CM

## 2020-04-17 MED ORDER — FASENRA PEN 30 MG/ML ~~LOC~~ SOAJ: SUBCUTANEOUS | 5 refills | Status: DC

## 2020-04-17 NOTE — Telephone Encounter (Signed)
Patient's John Chandler prior authorization is still valid. Ran test claim at [facility] and it processed fine. Please send prescription to [facility] to prevent medication delays for patient.

## 2020-04-17 NOTE — Telephone Encounter (Signed)
Prescription sent to [facility].  Medication will be couriered to clinic.  Please schedule injection appointment once received.   Verlin Fester, PharmD, [place], CPP Clinical Specialty Pharmacist (Rheumatology and Pulmonology)  04/17/2020 11:10 AM

## 2020-04-20 MED FILL — FASENRA PEN 30 MG/ML SOAJ: 30 | 56 days supply | Qty: 1 | Fill #0

## 2020-04-20 NOTE — Telephone Encounter (Signed)
Pt is already administering his medication at home.

## 2020-04-21 NOTE — Telephone Encounter (Signed)
Shipment has already been processed through [facility]. Should deliver to patient on 04/22/20. [facility]

## 2020-04-21 NOTE — Telephone Encounter (Signed)
[facility] is checking on the prior authorization for Fasenra injection. John Chandler phone number is [phone].

## 2020-06-15 MED FILL — FASENRA PEN 30 MG/ML SOAJ: 30 | 56 days supply | Qty: 1 | Fill #1

## 2020-08-05 MED FILL — FASENRA PEN 30 MG/ML SOAJ: 30 | 56 days supply | Qty: 1 | Fill #2

## 2020-10-06 ENCOUNTER — Telehealth: Payer: Self-pay | Admitting: Pharmacy Technician

## 2020-10-06 DIAGNOSIS — J4541 Moderate persistent asthma with (acute) exacerbation: Secondary | ICD-10-CM

## 2020-10-06 NOTE — Telephone Encounter (Signed)
Received message from [facility] that patient's insurance will no longer allow him to fill Fasenra through them. Patient must fill through [facility]. Called patient, left detailed message with new pharmacy's phone number.  Please send Fasenra prescription in for patient with copay card info:  BIN- [id number] ID- [id number] Grp- [id number] PCN- [id number]   Thanks! Chesni Vos

## 2020-10-07 MED ORDER — FASENRA PEN 30 MG/ML ~~LOC~~ SOAJ: SUBCUTANEOUS | 5 refills | Status: DC

## 2020-10-07 NOTE — Telephone Encounter (Signed)
Fasenra prescription, with BIN, ID, Group, PCN information, sent to [facility].

## 2021-01-18 ENCOUNTER — Telehealth: Payer: Self-pay | Admitting: Pharmacist

## 2021-01-18 NOTE — Telephone Encounter (Signed)
Submitted a Prior Authorization renewal request to [facility] for [facility] via phone. Will update once we receive a response - expected turnaround time is 72 hours per rep.  Phone: [phone] Ref #: [id number]

## 2021-01-22 NOTE — Progress Notes (Signed)
HPI male never smoker followed for chronic cough, allergic asthma/bronchitis.  Failed Xolair Started Nucala 08/11/2015, changed to [place] for insurance 2020 Allergy Profile 11/12/13- Total IgE 99.0, elevations for cat, dog, dust mite, grass pollens, tree pollens, ragweed.  Allergy skin test 02/26/14- Positive broadly for grass, weed and tree pollens, dust mite, cat and dog.  PFT 10/30/14- WNL FEV1/FVC 0.82 PFT 10/06/17- FVC 3.73/85%, FEV1 2.92/ 88%, NR to BD, overinflation and airtrapping, DLCO 74% a1AT WNL   2015 IgE 12/20/18- 184 H    EOS 0.8 H   On Nucala -----------------------------------------------------------------------  01/24/20- 65 year old male never smoker followed for chronic cough, allergic asthma/bronchitis., Complicated by DM 2, Migraine, GERD,  Failed Xolair, but did very well with Nucala 913/16- 12/20/2018. Changed to Harrington Challenger 2020 for better insurance coverage- uses every 8 weeks.  Proair HFA,  -----f/u Severe persistent asthma without complication. Breathing is at his baseline. Well pleased with stable control on Fasenra. He doesn't want any change. Much less need for rescue inhaler.    01/25/21- 65 year old male never smoker followed for chronic Cough, allergic Asthma/Bronchitis., Complicated by DM 2, Migraine, GERD,  Failed Xolair, but did very well with Nucala 08/11/15- 12/20/2018. Changed to Harrington Challenger 2020 for better insurance coverage- uses every 8 weeks.  Proair HFA, Harrington Challenger,  Covid vax-3 Moderna Flu vax-had He missed Harrington Challenger for 2 months last year when it was being reordered and during that time he began choking again, so he is convinced it helps. Retiring next year and will either stay on Fasenra or go back to [place], based on coverage at that time. Both have worked well. Rare need for albuterol hfa. Denies other interval problems or concerns.  ROS-see HPI  += positive Constitutional:   No-   weight loss, night sweats, fevers, chills, fatigue, lassitude. HEENT:   No-   headaches, difficulty swallowing, tooth/dental problems, sore throat,       No-  sneezing, itching, ear ache,  nasal congestion, post nasal drip,  CV:  No-   chest pain, orthopnea, PND, swelling in lower extremities, anasarca,                                                         dizziness, palpitations Resp: No-   shortness of breath with exertion or at rest.              No-   productive cough,+ non-productive cough,  No- coughing up of blood.              No-   change in color of mucus.  Wheezing-rare   Skin: No-   rash or lesions. GI:  No-   heartburn, indigestion, abdominal pain, nausea, vomiting,  GU:  MS:  No-   joint pain or swelling.   Neuro-     nothing unusual Psych:  No- change in mood or affect. No depression or anxiety.  No memory loss.  OBJ- Physical Exam  Medium build General- Alert, Oriented, Affect-appropriate, Distress- none acute Skin- rash-none, lesions- none, excoriation- none Lymphadenopathy- none Head- atraumatic            Eyes- Gross vision intact, PERRLA, conjunctivae and secretions clear            Ears- Hearing, canals-normal            Nose-  turbinate edema, no-Septal dev, mucus+watery clear, No-polyps,  or erosion, perforation             Throat- Mallampati II , mucosa+cobblestone , drainage- none, tonsils- atrophic Neck- flexible , trachea midline, no stridor , thyroid nl, carotid no bruit Chest - symmetrical excursion , unlabored           Heart/CV- RRR , no murmur , no gallop  , no rub, nl s1 s2                           - JVD- none , edema- none, stasis changes- none, varices- none           Lung- clear to P&A/unlabored, wheeze-none, cough-none, dullness-none, rub- none           Chest wall-  Abd-  Br/ Gen/ Rectal- Not done, not indicated Extrem- cyanosis- none, clubbing, none, atrophy- none, strength- nl Neuro- grossly intact to observation

## 2021-01-25 ENCOUNTER — Other Ambulatory Visit: Payer: Self-pay

## 2021-01-25 ENCOUNTER — Ambulatory Visit: Payer: BLUE CROSS/BLUE SHIELD | Admitting: Internal Medicine

## 2021-01-25 ENCOUNTER — Encounter: Payer: Self-pay | Admitting: Internal Medicine

## 2021-01-25 DIAGNOSIS — J3089 Other allergic rhinitis: Secondary | ICD-10-CM | POA: Diagnosis not present

## 2021-01-25 DIAGNOSIS — J4540 Moderate persistent asthma, uncomplicated: Secondary | ICD-10-CM | POA: Diagnosis not present

## 2021-01-25 DIAGNOSIS — J302 Other seasonal allergic rhinitis: Secondary | ICD-10-CM

## 2021-01-25 NOTE — Assessment & Plan Note (Signed)
No seasonal flare so far in this early Spring

## 2021-01-25 NOTE — Assessment & Plan Note (Signed)
Remains very well controlled on Fasenra, but did begin coughing again when he had to be off last year. He is convinced it helps and wants to continue. Ok to stay with Harrington Challenger or go back to [place] depending on coverage after he retires next year.

## 2021-01-25 NOTE — Patient Instructions (Signed)
We can continue Fasenra, I'm really glad it has worked so well for you.  Please call if we can help

## 2021-01-26 NOTE — Telephone Encounter (Signed)
Received notification from [facility] regarding a prior authorization for [employer]. Authorization has been APPROVED from 01/19/21 to 01/19/22.   Authorization # [id number] Phone # [phone]

## 2021-02-19 ENCOUNTER — Other Ambulatory Visit (HOSPITAL_COMMUNITY): Payer: Self-pay

## 2021-04-19 ENCOUNTER — Telehealth: Payer: Self-pay

## 2021-04-19 NOTE — Telephone Encounter (Signed)
Faxed PAP form to [employer] regarding FASENRA. Will provide update once we receive a determination.  Phone# [phone] Fax# [phone]

## 2021-04-22 NOTE — Telephone Encounter (Signed)
Refaxed Fasenra PAP application indicating pen formulation and that patient has received loading dose already  Fax: [phone] Phone: [phone]  Chesley Mires, PharmD, MPH Clinical Pharmacist (Rheumatology and Pulmonology)

## 2021-04-27 NOTE — Telephone Encounter (Signed)
Called [employer] [place], they have received updated rx clarification that patient has already received loading doses. Rep will reach out to patient to set up shipment. They will reach out if anything else is needed.  Phone: [phone]

## 2022-01-20 ENCOUNTER — Telehealth: Payer: Self-pay | Admitting: Internal Medicine

## 2022-01-20 DIAGNOSIS — J4541 Moderate persistent asthma with (acute) exacerbation: Secondary | ICD-10-CM

## 2022-01-20 NOTE — Telephone Encounter (Signed)
Patient has f/u on 01/25/22 with Dr. Maple Hudson. Will determine status at OV based on note  John Chandler, PharmD, MPH, BCPS Clinical Pharmacist (Rheumatology and Pulmonology)

## 2022-01-22 NOTE — Progress Notes (Signed)
HPI male never smoker followed for chronic cough, allergic asthma/bronchitis.  Failed Xolair Started Nucala 08/11/2015, changed to [place] for insurance 2020 Allergy Profile 11/12/13- Total IgE 99.0, elevations for cat, dog, dust mite, grass pollens, tree pollens, ragweed.  Allergy skin test 02/26/14- Positive broadly for grass, weed and tree pollens, dust mite, cat and dog.  PFT 10/30/14- WNL FEV1/FVC 0.82 PFT 10/06/17- FVC 3.73/85%, FEV1 2.92/ 88%, NR to BD, overinflation and airtrapping, DLCO 74% a1AT WNL   2015 IgE 12/20/18- 184 H    EOS 0.8 H   On Nucala -----------------------------------------------------------------------   01/25/21- 66 year old male never smoker followed for chronic Cough, allergic Asthma/Bronchitis., Complicated by DM 2, Migraine, GERD,  Failed Xolair, but did very well with Nucala 08/11/15- 12/20/2018. Changed to Harrington Challenger 2020 for better insurance coverage- uses every 8 weeks.  Proair HFA, Harrington Challenger,  Covid vax-3 Moderna Flu vax-had He missed Harrington Challenger for 2 months last year when it was being reordered and during that time he began choking again, so he is convinced it helps. Retiring next year and will either stay on Fasenra or go back to [place], based on coverage at that time. Both have worked well. Rare need for albuterol hfa. Denies other interval problems or concerns.  01/25/22- 66 year old male never smoker followed for chronic Cough, allergic Asthma/Bronchitis., Complicated by DM 2, Migraine, GERD,  Failed Xolair, but did very well with Nucala 08/11/15- 12/20/2018. Changed to Harrington Challenger in 2020 for better insurance coverage- uses every 8 weeks.  -Proair HFA, Harrington Challenger,  Covid vax-4 Moderna Flu vax-had ------Patient is doing good. No concerns ACT score 24 He remains very pleased with his breathing and credits Fasenra, which he tolerates well.. Uses rescue inhaler 1x/ month.  No longer has the chronic cough. 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  ROS-see HPI  += positive Constitutional:   No-   weight loss, night sweats, fevers, chills, fatigue, lassitude. HEENT:   No-  headaches, difficulty swallowing, tooth/dental problems, sore throat,       No-  sneezing, itching, ear ache,  nasal congestion, post nasal drip,  CV:  No-   chest pain, orthopnea, PND, swelling in lower extremities, anasarca, dizziness, palpitations Resp: No-   shortness of breath with exertion or at rest.              No-   productive cough,+ non-productive cough,  No- coughing up of blood.              No-   change in color of mucus.  Wheezing-rare   Skin: No-   rash or lesions. GI:  No-   heartburn, indigestion, abdominal pain, nausea, vomiting,  GU:  MS:  No-   joint pain or swelling.   Neuro-     nothing unusual Psych:  No- change in mood or affect. No depression or anxiety.  No memory loss.  OBJ- Physical Exam  Medium build General- Alert, Oriented, Affect-appropriate, Distress- none acute Skin- rash-none, lesions- none, excoriation- none Lymphadenopathy- none Head- atraumatic            Eyes- Gross vision intact, PERRLA, conjunctivae and secretions clear            Ears- Hearing, canals-normal            Nose-  turbinate edema, no-Septal dev, mucus+watery clear, No-polyps, erosion, perforation             Throat- Mallampati II , mucosa+cobblestone , drainage- none, tonsils- atrophic Neck- flexible , trachea  midline, no stridor , thyroid nl, carotid no bruit Chest - symmetrical excursion , unlabored           Heart/CV- RRR , no murmur , no gallop  , no rub, nl s1 s2                           - JVD- none , edema- none, stasis changes- none, varices- none           Lung- clear to P&A/unlabored, wheeze-none, cough-none, dullness-none, rub- none           Chest wall-  Abd-  Br/ Gen/ Rectal- Not done, not indicated Extrem- cyanosis- none, clubbing, none, atrophy- none, strength- nl Neuro- grossly intact to observation

## 2022-01-25 ENCOUNTER — Ambulatory Visit (INDEPENDENT_AMBULATORY_CARE_PROVIDER_SITE_OTHER): Payer: Medicare Other | Admitting: Internal Medicine

## 2022-01-25 ENCOUNTER — Other Ambulatory Visit: Payer: Self-pay

## 2022-01-25 ENCOUNTER — Encounter: Payer: Self-pay | Admitting: Internal Medicine

## 2022-01-25 DIAGNOSIS — J4540 Moderate persistent asthma, uncomplicated: Secondary | ICD-10-CM

## 2022-01-25 DIAGNOSIS — J3089 Other allergic rhinitis: Secondary | ICD-10-CM

## 2022-01-25 DIAGNOSIS — K219 Gastro-esophageal reflux disease without esophagitis: Secondary | ICD-10-CM

## 2022-01-25 DIAGNOSIS — J302 Other seasonal allergic rhinitis: Secondary | ICD-10-CM | POA: Diagnosis not present

## 2022-01-25 NOTE — Assessment & Plan Note (Signed)
Significant benefit from [place] with good control. Plan- continue [employer]

## 2022-01-25 NOTE — Patient Instructions (Signed)
We will continue Fasenra- I'm glad it helps you.  Ok to use your albuterol rescue inhaler as you have been.  Please call if we can help.

## 2022-01-25 NOTE — Assessment & Plan Note (Signed)
Recognizes potential seasonal pollen flare soon Plan- flonase and antihistamines if needed

## 2022-01-25 NOTE — Assessment & Plan Note (Signed)
Dysphagia being worked up

## 2022-01-27 MED ORDER — FASENRA PEN 30 MG/ML ~~LOC~~ SOAJ: SUBCUTANEOUS | 2 refills | Status: DC

## 2022-01-27 NOTE — Telephone Encounter (Signed)
Per OV note from 01/25/22 with Dr. Maple Hudson, patient will continue Fasenra. ? ?Rx for Fasenra 30mg  SQ every 8 weeks sent to [facility]. ? ?[facility], PharmD, MPH, BCPS ?Clinical Pharmacist (Rheumatology and Pulmonology) ?

## 2022-02-07 ENCOUNTER — Encounter: Payer: Self-pay | Admitting: Internal Medicine

## 2022-02-07 DIAGNOSIS — J4541 Moderate persistent asthma with (acute) exacerbation: Secondary | ICD-10-CM

## 2022-02-07 MED ORDER — FASENRA PEN 30 MG/ML ~~LOC~~ SOAJ: SUBCUTANEOUS | 2 refills | Status: DC

## 2022-02-07 NOTE — Telephone Encounter (Signed)
Rx for [facility] sent to [facility]. MyChart message sent to patient. ?

## 2023-01-11 NOTE — Progress Notes (Signed)
HPI male never smoker followed for chronic cough, allergic asthma/bronchitis.  Failed Xolair Started Nucala 08/11/2015, changed to [place] for insurance 2020 Allergy Profile 11/12/13- Total IgE 99.0, elevations for cat, dog, dust mite, grass pollens, tree pollens, ragweed.  Allergy skin test 02/26/14- Positive broadly for grass, weed and tree pollens, dust mite, cat and dog.  PFT 10/30/14- WNL FEV1/FVC 0.82 PFT 10/06/17- FVC 3.73/85%, FEV1 2.92/ 88%, NR to BD, overinflation and airtrapping, DLCO 74% a1AT WNL   2015 IgE 12/20/18- 184 H    EOS 0.8 H   On Nucala -----------------------------------------------------------------------   01/25/22- 67 year old male never smoker followed for chronic Cough, allergic Asthma/Bronchitis., Complicated by DM 2, Migraine, GERD,  Failed Xolair, but did very well with Nucala 08/11/15- 12/20/2018. Changed to Berna Bue in 2020 for better insurance coverage- uses every 8 weeks.  -Proair HFA, [place] Bue,  Covid vax-4 Moderna Flu vax-had ------Patient is doing good. No concerns ACT score 24 He remains very pleased with his breathing and credits Fasenra, which he tolerates well.. Uses rescue inhaler 1x/ month.  No longer has the chronic cough. Has been having dyshagia work-up and mentions Zenker's Diverticulum.  2/53/53-  67 year old male never smoker followed for chronic Cough, allergic Asthma/Bronchitis., Complicated by DM 2, Migraine, GERD,  Failed Xolair, but did very well with Nucala 08/11/15- 12/20/2018. Changed to Berna Bue in 2020 for better insurance coverage- uses every 8 weeks.  -Proair HFA, Berna Bue,  Covid vax-6 Moderna Flu vax-had -----Pt is doing well ACT score 23 His prescription for [place] expired. Off it, his cough and chest tightness returned. He had hel the last injection in reserve and after he took that his breathing has improved, so he is convinced Berna Bue still works well. His insurance has changed. We are needing to re-apply for Fasenra coverage.  Discussed with pharmacy team.  ROS-see HPI  += positive Constitutional:   No-   weight loss, night sweats, fevers, chills, fatigue, lassitude. HEENT:   No-  headaches, difficulty swallowing, tooth/dental problems, sore throat,       No-  sneezing, itching, ear ache,  nasal congestion, post nasal drip,  CV:  No-   chest pain, orthopnea, PND, swelling in lower extremities, anasarca, dizziness, palpitations Resp: No-   shortness of breath with exertion or at rest.              No-   productive cough,+ non-productive cough,  No- coughing up of blood.              No-   change in color of mucus.  Wheezing-rare   Skin: No-   rash or lesions. GI:  No-   heartburn, indigestion, abdominal pain, nausea, vomiting,  GU:  MS:  No-   joint pain or swelling.   Neuro-     nothing unusual Psych:  No- change in mood or affect. No depression or anxiety.  No memory loss.  OBJ- Physical Exam  Medium build General- Alert, Oriented, Affect-appropriate, Distress- none acute Skin- rash-none, lesions- none, excoriation- none Lymphadenopathy- none Head- atraumatic            Eyes- Gross vision intact, PERRLA, conjunctivae and secretions clear            Ears- Hearing, canals-normal            Nose-  turbinate edema, no-Septal dev,  clear, No-polyps, erosion, perforation             Throat- Mallampati II , mucosa+cobblestone , drainage- none, tonsils- atrophic Neck- flexible ,  trachea midline, no stridor , thyroid nl, carotid no bruit Chest - symmetrical excursion , unlabored           Heart/CV- RRR , no murmur , no gallop  , no rub, nl s1 s2                           - JVD- none , edema- none, stasis changes- none, varices- none           Lung- clear to P&A/unlabored, wheeze-none, cough-none, dullness-none, rub- none           Chest wall-  Abd-  Br/ Gen/ Rectal- Not done, not indicated Extrem- cyanosis- none, clubbing- none, atrophy- none, strength- nl Neuro- grossly intact to observation

## 2023-01-16 ENCOUNTER — Ambulatory Visit (INDEPENDENT_AMBULATORY_CARE_PROVIDER_SITE_OTHER): Payer: Medicare Other | Admitting: Internal Medicine

## 2023-01-16 ENCOUNTER — Telehealth: Payer: Self-pay | Admitting: Pharmacist

## 2023-01-16 ENCOUNTER — Encounter: Payer: Self-pay | Admitting: Internal Medicine

## 2023-01-16 VITALS — BP 106/64 | HR 66 | Ht 68.0 in | Wt 186.6 lb

## 2023-01-16 DIAGNOSIS — J4541 Moderate persistent asthma with (acute) exacerbation: Secondary | ICD-10-CM

## 2023-01-16 NOTE — Assessment & Plan Note (Signed)
John Chandler has been steroid-sparing, important for diabetic.

## 2023-01-16 NOTE — Patient Instructions (Signed)
We will renew John Chandler to continue  Please call if we can help

## 2023-01-16 NOTE — Assessment & Plan Note (Signed)
Depends on control by [place] with demonstrated worsening when that stopped. Plan- application to renew [place]

## 2023-01-16 NOTE — Telephone Encounter (Signed)
Pt had OV with Dr. Annamaria Boots today and stated he's had change in insurance. Please run BIV for Fasenra. Appears patient was previously enrolled in PAP. PAP application completed at OV today. Placed in PAP pending info folder in pharmacy office   Knox Saliva, PharmD, MPH, BCPS, CPP Clinical Pharmacist (Rheumatology and Pulmonology)

## 2023-01-17 NOTE — Telephone Encounter (Signed)
Submitted a Prior Authorization request to [facility] for [facility] via CoverMyMeds. Will update once we receive a response.  Key: [id number]  Knox Saliva, PharmD, MPH, BCPS, CPP Clinical Pharmacist (Rheumatology and Pulmonology)

## 2023-01-18 ENCOUNTER — Other Ambulatory Visit (HOSPITAL_COMMUNITY): Payer: Self-pay

## 2023-01-18 NOTE — Telephone Encounter (Signed)
Received notification from [facility] regarding a prior authorization for [facility]. Authorization has been APPROVED from 01/17/2023 until further notice. Approval letter sent to scan center.  Per test claim, copay for 56 days supply (1 pen) is [phone]  Patient can fill through [place]: [phone]   Authorization # [id number] Phone: [phone]  Submitted Patient Assistance Application to [place] for [facility] along with provider portion, PA and income documents. Will update patient when we receive a response.  Fax# [phone] Phone# [phone]  Knox Saliva, PharmD, MPH, BCPS, CPP Clinical Pharmacist (Rheumatology and Pulmonology)

## 2023-02-01 NOTE — Telephone Encounter (Signed)
Received fax from [place] for [facility] patient assistance, patient's application has been DENIED due to requiring additional eligibility information. Documentation required is EITHER:  -Proof showing that 10% of 2023 household income was spent on medical expenses  OR  -Proof that pt was denied by 3 grants in 2024.  Submitted redetermination letter to [employer] along with screen shot of 3+ closed grants as of 02/01/23. Will await response.

## 2023-02-06 NOTE — Telephone Encounter (Signed)
Received a fax from  [place] regarding an approval for [facility] patient assistance from 02/03/2023 to 11/28/2023. Approval letter sent to scan center.  Reached out to pt to provide update, however he states that he had just gotten off the phone with them roughly 2 minutes prior and has scheduled his next shipment.  Nothing further is needed at this time.

## 2023-02-07 MED ORDER — FASENRA PEN 30 MG/ML ~~LOC~~ SOAJ: SUBCUTANEOUS | 2 refills | Status: DC

## 2023-02-07 NOTE — Addendum Note (Signed)
Addended by: Cassandria Anger on: 02/07/2023 12:45 PM   Modules accepted: Orders

## 2023-09-25 ENCOUNTER — Telehealth: Payer: Self-pay

## 2023-09-25 NOTE — Telephone Encounter (Signed)
Received fax from [employer] stating that pt is being denied PAP for 2025 based on income eligibility requirements not being met. The pt is also being notified via mail. Pt's current enrollment is good until 11/28/2023. Contacted pt and discussed, pt verbalized understanding and stated that he has no concerns about being unable to meet the $2,000 maximum OOP for 2025.   Denial letter sent to scan center, nothing further is needed at this time.

## 2023-11-02 ENCOUNTER — Other Ambulatory Visit: Payer: Self-pay | Admitting: Pharmacist

## 2023-11-02 DIAGNOSIS — J4541 Moderate persistent asthma with (acute) exacerbation: Secondary | ICD-10-CM

## 2023-11-02 MED ORDER — FASENRA PEN 30 MG/ML ~~LOC~~ SOAJ: 30.0000 mg | SUBCUTANEOUS | 1 refills | Status: DC

## 2023-12-06 ENCOUNTER — Other Ambulatory Visit (HOSPITAL_COMMUNITY): Payer: Self-pay

## 2023-12-06 ENCOUNTER — Telehealth: Payer: Self-pay | Admitting: Pharmacist

## 2023-12-06 DIAGNOSIS — J4541 Moderate persistent asthma with (acute) exacerbation: Secondary | ICD-10-CM

## 2023-12-06 NOTE — Telephone Encounter (Signed)
 Patient no longer eligible for Fasenra . John Chandler spoke with patient last year and patient was confident he'd meet $2000 max oop for prescriptions this calendar year  Per test claim, copay for Fasenra  is $1840.77  ATC patient regarding copay and switching pharmacies prior to next dose due date. Left VM

## 2023-12-12 ENCOUNTER — Other Ambulatory Visit (HOSPITAL_COMMUNITY): Payer: Self-pay

## 2023-12-12 NOTE — Telephone Encounter (Signed)
 Per test claim, copay for Harrington Challenger is $1840.77  ATC patient - phone went to VM. Left VM requesting return call  Chesley Mires, PharmD, MPH, BCPS, CPP Clinical Pharmacist (Rheumatology and Pulmonology)

## 2023-12-25 MED ORDER — FASENRA PEN 30 MG/ML ~~LOC~~ SOAJ: 30.0000 mg | SUBCUTANEOUS | 0 refills | Status: DC

## 2023-12-25 NOTE — Telephone Encounter (Signed)
Spoke with patient - he states he is aware of Fasenra PAP denial and max $2000 OOP max. He states he is due for next Fasenra injection on 01/15/2024 and has one pen injector at home for this. Will be due for subsequent dose on 03/11/2024  Rx sent to [facility] for onboarding in the latter half of March 2025  Chesley Mires, PharmD, MPH, BCPS, CPP Clinical Pharmacist (Rheumatology and Pulmonology)

## 2024-01-17 ENCOUNTER — Other Ambulatory Visit: Payer: Self-pay | Admitting: Pharmacist

## 2024-01-17 NOTE — Progress Notes (Signed)
 Patient has been on Fasenra for asthma for some time. No longer eligible for patient assistance program. Patient is willing to pay copay  Continue Fasenra 30mg  SQ every 8 weeks  Chesley Mires, PharmD, MPH, BCPS, CPP Clinical Pharmacist (Rheumatology and Pulmonology)

## 2024-02-02 NOTE — Progress Notes (Signed)
 HPI male never smoker followed for chronic cough, allergic asthma/bronchitis.  Failed Xolair Started Nucala 08/11/2015, changed to [place] for insurance 2020 Allergy Profile 11/12/13- Total IgE 99.0, elevations for cat, dog, dust mite, grass pollens, tree pollens, ragweed.  Allergy skin test 02/26/14- Positive broadly for grass, weed and tree pollens, dust mite, cat and dog.  PFT 10/30/14- WNL FEV1/FVC 0.82 PFT 10/06/17- FVC 3.73/85%, FEV1 2.92/ 88%, NR to BD, overinflation and airtrapping, DLCO 74% a1AT WNL   2015 IgE 12/20/18- 184 H    EOS 0.8 H   On Nucala -----------------------------------------------------------------------   2/66/41-  68 year old male never smoker followed for chronic Cough, allergic Asthma/Bronchitis., Complicated by DM 2, Migraine, GERD,  Failed Xolair, but did very well with Nucala 08/11/15- 12/20/2018. Changed to Harrington Challenger in 2020 for better insurance coverage- uses every 8 weeks.  -Proair HFA, Harrington Challenger,  Covid vax-6 Moderna Flu vax-had -----Pt is doing well ACT score 23 His prescription for Fasenra expired. Off it, his cough and chest tightness returned. He had hel the last injection in reserve and after he took that his breathing has improved, so he is convinced Harrington Challenger still works well. His insurance has changed. We are needing to re-apply for Fasenra coverage. Discussed with pharmacy team.  02/05/24- 68 year old male never smoker followed for chronic Cough, allergic Asthma/Bronchitis., Complicated by DM 2, Migraine, GERD,  Failed Xolair, but did very well with Nucala 08/11/15- 12/20/2018. Changed to Harrington Challenger in 2020 for better insurance coverage- uses every 8 weeks.  -Proair HFA, Harrington Challenger,  -----Doing well.  Yearly follow up Discussed the use of AI scribe software for clinical note transcription with the patient, who gave verbal consent to proceed. History of Present Illness   The patient, with a history of asthma, reports that the Fasenra injections have been  beneficial in controlling his symptoms. He notes that when he tries to extend the time between injections, he experiences wheezing. He has not noticed any significant changes in his overall health. He has an albuterol rescue inhaler, which he uses infrequently, approximately once every couple of weeks. The inhalers he currently has expired several years ago, and he requests a renewal of the prescription. He also mentions that he is unsure about his coverage for Harrington Challenger, as he has received conflicting letters about it.     ROS-see HPI  += positive Constitutional:   No-   weight loss, night sweats, fevers, chills, fatigue, lassitude. HEENT:   No-  headaches, difficulty swallowing, tooth/dental problems, sore throat,       No-  sneezing, itching, ear ache,  nasal congestion, post nasal drip,  CV:  No-   chest pain, orthopnea, PND, swelling in lower extremities, anasarca, dizziness, palpitations Resp: No-   shortness of breath with exertion or at rest.              No-   productive cough,+ non-productive cough,  No- coughing up of blood.              No-   change in color of mucus.  Wheezing-rare   Skin: No-   rash or lesions. GI:  No-   heartburn, indigestion, abdominal pain, nausea, vomiting,  GU:  MS:  No-   joint pain or swelling.   Neuro-     nothing unusual Psych:  No- change in mood or affect. No depression or anxiety.  No memory loss.  OBJ- Physical Exam  Medium build General- Alert, Oriented, Affect-appropriate, Distress- none acute Skin- rash-none, lesions- none, excoriation- none Lymphadenopathy-  none Head- atraumatic            Eyes- Gross vision intact, PERRLA, conjunctivae and secretions clear            Ears- Hearing, canals-normal            Nose-  turbinate edema, no-Septal dev,  clear, No-polyps, erosion, perforation             Throat- Mallampati II , mucosa+cobblestone , drainage- none, tonsils- atrophic Neck- flexible , trachea midline, no stridor , thyroid nl, carotid no  bruit Chest - symmetrical excursion , unlabored           Heart/CV- RRR , no murmur , no gallop  , no rub, nl s1 s2                           - JVD- none , edema- none, stasis changes- none, varices- none           Lung- clear to P&A/unlabored, wheeze-none, cough-none, dullness-none, rub- none           Chest wall-  Abd-  Br/ Gen/ Rectal- Not done, not indicated Extrem- cyanosis- none, clubbing- none, atrophy- none, strength- nl Neuro- grossly intact to observation Assessment and Plan    Asthma Fasenra effectively controls symptoms. Wheezing occurs without it or with extended dosing. Infrequent albuterol use. - Continue Fasenra regimen. - Renew albuterol prescription, provide three inhalers. - Advise to contact clinic if issues arise.  Insurance Coverage Uncertainty Confusion regarding insurance coverage for Visenya due to conflicting information. - Advise to clarify insurance coverage with provider. We are reaching out to our Pharmacy team.

## 2024-02-05 ENCOUNTER — Encounter: Payer: Self-pay | Admitting: Internal Medicine

## 2024-02-05 ENCOUNTER — Ambulatory Visit (INDEPENDENT_AMBULATORY_CARE_PROVIDER_SITE_OTHER): Payer: Medicare Other | Admitting: Internal Medicine

## 2024-02-05 VITALS — BP 116/64 | HR 78 | Temp 97.7°F | Ht 68.0 in | Wt 190.4 lb

## 2024-02-05 DIAGNOSIS — J45909 Unspecified asthma, uncomplicated: Secondary | ICD-10-CM | POA: Diagnosis not present

## 2024-02-05 DIAGNOSIS — J4541 Moderate persistent asthma with (acute) exacerbation: Secondary | ICD-10-CM

## 2024-02-05 MED ORDER — ALBUTEROL SULFATE HFA 108 (90 BASE) MCG/ACT IN AERS
1.0000 | INHALATION_SPRAY | Freq: Four times a day (QID) | RESPIRATORY_TRACT | 3 refills | Status: DC | PRN
Start: 2024-02-05 — End: 2024-02-05

## 2024-02-05 MED ORDER — ALBUTEROL SULFATE HFA 108 (90 BASE) MCG/ACT IN AERS: 1.0000 | INHALATION_SPRAY | Freq: Four times a day (QID) | RESPIRATORY_TRACT | 3 refills | Status: AC | PRN

## 2024-02-05 NOTE — Assessment & Plan Note (Signed)
 Benefits from [place]. We will continue  current treatment plan.

## 2024-02-05 NOTE — Patient Instructions (Signed)
 Glad you are doing well.  Albuterol rescue inhaler refilled  We will continue Harrington Challenger- glad it helps

## 2024-02-15 ENCOUNTER — Other Ambulatory Visit: Payer: Self-pay

## 2024-02-15 ENCOUNTER — Encounter: Payer: Self-pay | Admitting: Internal Medicine

## 2024-02-15 DIAGNOSIS — J4541 Moderate persistent asthma with (acute) exacerbation: Secondary | ICD-10-CM

## 2024-02-15 MED ORDER — FASENRA PEN 30 MG/ML ~~LOC~~ SOAJ
30.0000 mg | SUBCUTANEOUS | 3 refills | Status: DC
  Filled 2024-02-19: qty 1, 56d supply, fill #0
  Filled 2024-04-04 (×2): qty 1, 56d supply, fill #1
  Filled 2024-06-18: qty 1, 56d supply, fill #2
  Filled 2024-08-14 – 2024-08-27 (×2): qty 1, 56d supply, fill #3

## 2024-02-15 NOTE — Addendum Note (Signed)
 Addended by: Delrae Rend on: 02/15/2024 12:11 PM   Modules accepted: Orders

## 2024-02-19 ENCOUNTER — Other Ambulatory Visit (HOSPITAL_COMMUNITY): Payer: Self-pay

## 2024-02-19 ENCOUNTER — Other Ambulatory Visit: Payer: Self-pay

## 2024-02-19 NOTE — Progress Notes (Signed)
 Specialty Pharmacy Initial Fill Coordination Note  John Chandler is a 68 y.o. male contacted today regarding initial fill of specialty medication(s) Benralizumab Harrington Challenger Pen)   Patient requested Delivery   Delivery date: 02/21/24   Verified address: [address]   [place] [address]   Medication will be filled on 02/20/24.   Patient is aware of $873.52 copayment. Payment info collected and forwarded to [place] at [facility]

## 2024-02-20 ENCOUNTER — Other Ambulatory Visit: Payer: Self-pay

## 2024-02-20 ENCOUNTER — Other Ambulatory Visit (HOSPITAL_COMMUNITY): Payer: Self-pay

## 2024-02-27 ENCOUNTER — Encounter: Payer: Self-pay | Admitting: Internal Medicine

## 2024-02-27 NOTE — Telephone Encounter (Signed)
 Please send order to [employer] (pharmacy can help) to replace most recent Fasenra pen due to malfunction with failed injection, as per his recent communication.. He has been doing this a long time and knows how to use the device and how it should function.

## 2024-02-28 ENCOUNTER — Other Ambulatory Visit: Payer: Self-pay

## 2024-02-28 ENCOUNTER — Telehealth: Payer: Self-pay

## 2024-02-28 ENCOUNTER — Other Ambulatory Visit (HOSPITAL_COMMUNITY): Payer: Self-pay

## 2024-02-28 NOTE — Progress Notes (Signed)
 [employer] called stating that patient contacted them about Fasenra pen misfiring on 3/29 and a potential replacement product. Manufacturer cannot supply replacement directly to patient but they are able to provide credit through our wholesaler if we can dispense replacement. Once manager approval is obtained I will call manufacturer back to coordinate at [phone] option 2. Case # for this issue is [id number].

## 2024-02-28 NOTE — Progress Notes (Signed)
 John Chandler has approved to provide replacement to patient from stock and manufacturer will credit our wholesaler account. Case # specifically for credit is [id number]. Account information was provided to manufacturer today and they will be faxing confirmation with my name on it to [facility] today.   LVM for patient to advise of replacement and coordinate delivery.

## 2024-02-28 NOTE — Telephone Encounter (Signed)
 Spoke with Stark Bray RN Case # [id number] regarding prior mychart message .Advised patient that [employer] will send patient a replacement free of charge to patient's pharmacy.  Patient's thanked me and patient's voice was understanding.Nothing else further needed.

## 2024-02-29 ENCOUNTER — Other Ambulatory Visit (HOSPITAL_COMMUNITY): Payer: Self-pay

## 2024-02-29 ENCOUNTER — Other Ambulatory Visit: Payer: Self-pay

## 2024-02-29 NOTE — Progress Notes (Signed)
 Shipping replacement Fasenra for patient on 04/03 for 04/04 delivery.   New Tracking Number: [id number]  Joe/Skylar are aware to adjust inventory.

## 2024-02-29 NOTE — Progress Notes (Signed)
 LVM again for patient to coordinate delivery of replacement and sent mychart message as well.

## 2024-04-03 ENCOUNTER — Other Ambulatory Visit (HOSPITAL_COMMUNITY): Payer: Self-pay

## 2024-04-04 ENCOUNTER — Other Ambulatory Visit: Payer: Self-pay

## 2024-04-04 ENCOUNTER — Other Ambulatory Visit (HOSPITAL_COMMUNITY): Payer: Self-pay

## 2024-04-04 NOTE — Progress Notes (Signed)
 Specialty Pharmacy Refill Coordination Note  John Chandler is a 68 y.o. male contacted today regarding refills of specialty medication(s) Fasenra .  Patient requested (Patient-Rptd) Delivery   Delivery date: (Patient-Rptd) 04/09/24   Verified address: (Patient-Rptd) [address] [place], [place] [address]   Medication will be filled on 04/08/24.   This medication requires a new prior authorization, and is currently being processed by the PA team. Specialty Pharmacy team will contact patient if any delays arise.

## 2024-04-08 ENCOUNTER — Telehealth: Payer: Self-pay | Admitting: Pharmacist

## 2024-04-08 ENCOUNTER — Other Ambulatory Visit (HOSPITAL_COMMUNITY): Payer: Self-pay

## 2024-04-08 ENCOUNTER — Other Ambulatory Visit: Payer: Self-pay

## 2024-04-08 NOTE — Telephone Encounter (Signed)
 Noted - routing to specialty pharmacy pool to reprocess Fasenra  rx  Geraldene Kleine, PharmD, MPH, BCPS, CPP Clinical Pharmacist (Rheumatology and Pulmonology)

## 2024-04-08 NOTE — Telephone Encounter (Signed)
 John Chandler w/ [employer] states this med,  FASENRA  has been approved from 04/08/2024 to 04/08/2025. John Chandler will be sending fax w/ this information.  Verified fax number.

## 2024-04-08 NOTE — Telephone Encounter (Signed)
 Submitted a Prior Authorization request to [facility] for FASENRA  via CoverMyMeds. Will update once we receive a response.  Key: [id number]

## 2024-06-14 ENCOUNTER — Other Ambulatory Visit (HOSPITAL_COMMUNITY): Payer: Self-pay

## 2024-06-18 ENCOUNTER — Other Ambulatory Visit (HOSPITAL_COMMUNITY): Payer: Self-pay

## 2024-06-20 ENCOUNTER — Other Ambulatory Visit: Payer: Self-pay

## 2024-06-20 NOTE — Progress Notes (Signed)
 Specialty Pharmacy Refill Coordination Note  John Chandler is a 69 y.o. male contacted today regarding refills of specialty medication(s) Benralizumab  (Fasenra  Pen)   Patient requested Delivery   Delivery date: 06/25/24   Verified address: [address]   [place] [address]   Medication will be filled on 06/24/24.

## 2024-06-24 ENCOUNTER — Other Ambulatory Visit: Payer: Self-pay

## 2024-08-12 ENCOUNTER — Other Ambulatory Visit: Payer: Self-pay

## 2024-08-14 ENCOUNTER — Other Ambulatory Visit: Payer: Self-pay

## 2024-08-16 ENCOUNTER — Other Ambulatory Visit (HOSPITAL_COMMUNITY): Payer: Self-pay

## 2024-08-27 ENCOUNTER — Other Ambulatory Visit: Payer: Self-pay

## 2024-08-27 NOTE — Progress Notes (Signed)
 Specialty Pharmacy Refill Coordination Note  John Chandler is a 68 y.o. male contacted today regarding refills of specialty medication(s) Benralizumab  (Fasenra  Pen)   Patient requested Delivery   Delivery date: 08/29/24   Verified address: [address]   [place] [address]   Medication will be filled on 08/28/24.

## 2024-10-11 ENCOUNTER — Other Ambulatory Visit: Payer: Self-pay | Admitting: Internal Medicine

## 2024-10-11 ENCOUNTER — Other Ambulatory Visit: Payer: Self-pay

## 2024-10-11 DIAGNOSIS — J4541 Moderate persistent asthma with (acute) exacerbation: Secondary | ICD-10-CM

## 2024-10-11 NOTE — Progress Notes (Signed)
 Specialty Pharmacy Refill Coordination Note  John Chandler is a 68 y.o. male contacted today regarding refills of specialty medication(s) Benralizumab  (Fasenra  Pen)   Patient requested Delivery   Delivery date: 10/22/24   Verified address: [address]   [place] [address]   Medication will be filled on: 10/21/24

## 2024-10-11 NOTE — Progress Notes (Signed)
 Specialty Pharmacy Ongoing Clinical Assessment Note  John Chandler is a 68 y.o. male who is being followed by the specialty pharmacy service for RxSp Asthma/COPD   Patient's specialty medication(s) reviewed today: Benralizumab  (Fasenra  Pen)   Missed doses in the last 4 weeks: 0   Patient/Caregiver did not have any additional questions or concerns.   Therapeutic benefit summary: Patient is achieving benefit   Adverse events/side effects summary: No adverse events/side effects   Patient's therapy is appropriate to: Continue    Goals Addressed             This Visit's Progress    Maintain optimal adherence to therapy   On track    Patient is on track. Patient will maintain adherence and be monitored by provider to determine if a change in treatment plan is warranted         Follow up: 12 months  Gianah Batt M [facility] Specialty Pharmacist

## 2024-10-14 ENCOUNTER — Other Ambulatory Visit: Payer: Self-pay

## 2024-10-14 MED ORDER — FASENRA PEN 30 MG/ML ~~LOC~~ SOAJ
30.0000 mg | SUBCUTANEOUS | 2 refills | Status: AC
  Filled 2024-10-14: qty 1, 56d supply, fill #0
  Filled 2024-12-06 – 2024-12-12 (×2): qty 1, 56d supply, fill #1

## 2024-10-14 NOTE — Telephone Encounter (Signed)
 Pt requesting refill of specialty medication - routing to Rx team to advise.

## 2024-10-14 NOTE — Telephone Encounter (Signed)
 Refill sent for FASENRA  to [facility]: [id number]   Dose: 30mg  [place] every 8 weeks  Last OV: 02/05/2024 Provider: Dr. Neysa - switching to Dr. Theodoro  Next OV: 02/13/2025 with Dr. Theodoro  Aleck Puls, PharmD, BCPS Clinical Pharmacist  [facility]

## 2024-10-15 ENCOUNTER — Other Ambulatory Visit (HOSPITAL_COMMUNITY): Payer: Self-pay

## 2024-10-21 ENCOUNTER — Other Ambulatory Visit: Payer: Self-pay

## 2024-12-06 ENCOUNTER — Other Ambulatory Visit: Payer: Self-pay | Admitting: Pharmacy Technician

## 2024-12-06 ENCOUNTER — Other Ambulatory Visit: Payer: Self-pay

## 2024-12-09 ENCOUNTER — Other Ambulatory Visit: Payer: Self-pay

## 2024-12-09 ENCOUNTER — Telehealth: Payer: Self-pay

## 2024-12-10 ENCOUNTER — Other Ambulatory Visit: Payer: Self-pay

## 2024-12-12 ENCOUNTER — Other Ambulatory Visit: Payer: Self-pay

## 2024-12-12 ENCOUNTER — Other Ambulatory Visit (HOSPITAL_COMMUNITY): Payer: Self-pay

## 2024-12-12 NOTE — Progress Notes (Signed)
 Specialty Pharmacy Refill Coordination Note  John Chandler is a 69 y.o. male contacted today regarding refills of specialty medication(s) Benralizumab  (Fasenra  Pen)   Patient requested Delivery   Delivery date: 12/17/24   Verified address: [address]  [place]   Medication will be filled on: 12/16/24

## 2024-12-16 ENCOUNTER — Other Ambulatory Visit: Payer: Self-pay

## 2025-02-13 ENCOUNTER — Encounter
